# Patient Record
Sex: Female | Born: 1970 | Race: Black or African American | Hispanic: No | Marital: Married | Smoking: Never smoker
Health system: Southern US, Community
[De-identification: ages and names within clinical notes are randomized; demographics above are authoritative.]

## PROBLEM LIST (undated history)

## (undated) DIAGNOSIS — D259 Leiomyoma of uterus, unspecified: Secondary | ICD-10-CM

## (undated) DIAGNOSIS — Z789 Other specified health status: Secondary | ICD-10-CM

## (undated) DIAGNOSIS — E669 Obesity, unspecified: Secondary | ICD-10-CM

## (undated) HISTORY — PX: DIAGNOSTIC LAPAROSCOPY: SUR761

## (undated) HISTORY — DX: Leiomyoma of uterus, unspecified: D25.9

## (undated) HISTORY — DX: Obesity, unspecified: E66.9

---

## 2003-12-07 ENCOUNTER — Other Ambulatory Visit: Admission: RE | Admit: 2003-12-07 | Discharge: 2003-12-07 | Payer: Self-pay | Admitting: Obstetrics and Gynecology

## 2005-06-29 ENCOUNTER — Ambulatory Visit (HOSPITAL_COMMUNITY): Admission: RE | Admit: 2005-06-29 | Discharge: 2005-06-29 | Payer: Self-pay | Admitting: Internal Medicine

## 2005-08-02 ENCOUNTER — Other Ambulatory Visit: Admission: RE | Admit: 2005-08-02 | Discharge: 2005-08-02 | Payer: Self-pay | Admitting: Gynecology

## 2005-08-14 ENCOUNTER — Encounter: Admission: RE | Admit: 2005-08-14 | Discharge: 2005-08-14 | Payer: Self-pay | Admitting: Family Medicine

## 2005-10-19 ENCOUNTER — Ambulatory Visit (HOSPITAL_COMMUNITY): Admission: RE | Admit: 2005-10-19 | Discharge: 2005-10-19 | Payer: Self-pay | Admitting: Gynecology

## 2005-10-19 ENCOUNTER — Encounter (INDEPENDENT_AMBULATORY_CARE_PROVIDER_SITE_OTHER): Payer: Self-pay | Admitting: Specialist

## 2008-06-24 ENCOUNTER — Inpatient Hospital Stay (HOSPITAL_COMMUNITY): Admission: AD | Admit: 2008-06-24 | Discharge: 2008-06-28 | Payer: Self-pay | Admitting: Obstetrics and Gynecology

## 2008-06-25 ENCOUNTER — Encounter (INDEPENDENT_AMBULATORY_CARE_PROVIDER_SITE_OTHER): Payer: Self-pay | Admitting: Obstetrics and Gynecology

## 2008-06-29 ENCOUNTER — Encounter: Admission: RE | Admit: 2008-06-29 | Discharge: 2008-07-29 | Payer: Self-pay | Admitting: Obstetrics and Gynecology

## 2008-07-30 ENCOUNTER — Encounter: Admission: RE | Admit: 2008-07-30 | Discharge: 2008-08-28 | Payer: Self-pay | Admitting: Obstetrics and Gynecology

## 2008-08-29 ENCOUNTER — Encounter: Admission: RE | Admit: 2008-08-29 | Discharge: 2008-09-08 | Payer: Self-pay | Admitting: Obstetrics and Gynecology

## 2010-07-20 LAB — CBC
HCT: 32.5 % — ABNORMAL LOW (ref 36.0–46.0)
Hemoglobin: 10.8 g/dL — ABNORMAL LOW (ref 12.0–15.0)
MCHC: 33.3 g/dL (ref 30.0–36.0)
MCV: 88.3 fL (ref 78.0–100.0)
Platelets: 140 10*3/uL — ABNORMAL LOW (ref 150–400)
Platelets: 170 10*3/uL (ref 150–400)
RBC: 3.68 MIL/uL — ABNORMAL LOW (ref 3.87–5.11)
RBC: 4.33 MIL/uL (ref 3.87–5.11)
RDW: 13.9 % (ref 11.5–15.5)
WBC: 7.7 10*3/uL (ref 4.0–10.5)
WBC: 9.8 10*3/uL (ref 4.0–10.5)

## 2010-07-20 LAB — RPR: RPR Ser Ql: NONREACTIVE

## 2010-08-22 NOTE — Discharge Summary (Signed)
NAME:  Diane Ryan, Diane Ryan          ACCOUNT NO.:  [id number]   MEDICAL RECORD NO.:  [id number]          PATIENT TYPE:  INP   LOCATION:  9125                          FACILITY:  [facility]   PHYSICIAN:  Maxie Better, M.D.DATE OF BIRTH:  16-Sep-1970   DATE OF ADMISSION:  06/24/2008  DATE OF DISCHARGE:  06/28/2008                               DISCHARGE SUMMARY   ADMISSION DIAGNOSES:  Oblique lie, term gestation, desires  sterilization, requesting primary cesarean section. Fibroid uterus,  Biophysical profile 6/10 x2 days.   DISCHARGE DIAGNOSES:  Fibroid uterus, desires sterilization, oblique  lie, term gestation, delivered.  Biophysical profile 6/10 x2 days.   PROCEDURE:  Primary cesarean section, low vertical uterine incision,  right partial salpingectomy, myomectomy.   HISTORY OF PRESENT ILLNESS:  This is a 40 year old, gravida 4, para 0-0-  3-0, married black female at 39-5/7th weeks with known fibroid uterus  who was found to have an oblique lie and biophysical profile of 6/10 x2  days, nonreactive NSTs with minimal variability x2, who is now admitted  for a primary cesarean section.  The patient requested to have a  cesarean section and in light of the malpresentation her request was  honored.  She also desired permanent sterilization.   HOSPITAL COURSE:  The patient was admitted to [facility].  She  underwent a primary cesarean section.  A live female from the left lower  quadrant was delivered.  Cord around the neck x2 which was reducible.  Apgars of 9 and 9.  Weight of 6 pounds 10 ounces.  The patient had a  right lateral fibroid that needed to be removed in order to facilitate  closure of a low vertical uterine incision.  She had surgical separation  of her left tube already from previous surgery.  Ovaries were normal.  Right tube was normal.  There was also submucosal fibroid which was also  removed.  Both the placenta and myoma x2 and a portion of the right tube  was  sent to Pathology, results of which are pending at this time.  The  patient on postop day #1 had a hemoglobin of 10.8, hematocrit of 22.5,  platelet count of 140,000.  Starting platelet count was 170,000.  The  patient had positive flatus, tolerating a regular diet.  She had  initially slow bowel function which subsequently resolved.  She remained  afebrile throughout her course.  The incision had no erythema,  induration, or exudate, and she was deemed well to be discharged on  postop day #4.   DISPOSITION:  Home.   CONDITION:  Stable.   DISCHARGE MEDICATIONS:  1. Motrin 800 mg every 6-8 hours p.r.n. pain.  2. Percocet 5/325 mg 1-2 tablets every 4-6 hours p.r.n. pain #30.  3. Prenatal vitamins 1 p.o. daily.   DISCHARGE INSTRUCTIONS:  Per the postpartum booklet given.  Followup  appointment at [facility] in 6 weeks.      Maxie Better, M.D.  Electronically Signed     [place]/MEDQ  D:  06/28/2008  T:  06/29/2008  Job:  [id number]

## 2010-08-22 NOTE — Op Note (Signed)
NAME:  Diane Ryan, Diane Ryan          ACCOUNT NO.:  [id number]   MEDICAL RECORD NO.:  [id number]          PATIENT TYPE:  INP   LOCATION:  9125                          [facility]  [facility]   PHYSICIAN:  Maxie Better, M.D.DATE OF BIRTH:  1970/05/21   DATE OF PROCEDURE:  06/24/2008  DATE OF DISCHARGE:                               OPERATIVE REPORT   PREOPERATIVE DIAGNOSES:  Fibroid uterus, oblique lie, biophysical  profile with 6/10 x2 days, desires sterilization, term gestation.   PROCEDURE:  Primary cesarean section, low vertical uterine incision,  right partial salpingectomy, myomectomy x2.   POSTOPERATIVE DIAGNOSES:  Fibroid uterus, biophysical profile 6/10 x2  days, oblique lie, desires sterilization, term gestation.   ANESTHESIA:  Spinal.   SURGEON:  Maxie Better, MD   ASSISTANT:  Genia Del, MD   PROCEDURE:  Under adequate spinal anesthesia, the patient was placed in  the supine position with a left lateral tilt.  An indwelling Foley  catheter was sterilely placed.  The patient was sterilely prepped and  draped in the usual fashion.  A 0.25% Marcaine was injected along the  planned Pfannenstiel skin incision site.  Pfannenstiel skin incision was  then made, carried down to the rectus fascia.  Rectus fascia was opened  transversely.  Rectus fascia was then bluntly and sharply dissected off  the rectus muscle in superior and inferior fashion.  The rectus muscles  split in midline.  The peritoneum was entered bluntly and extended.  The  vesicouterine peritoneum was inspected and the uterus was rotated to the  right lower uterine segment to the lateral side of fibroid about 6 cm  and the presenting part was in the left lower quadrant.  Prominent  vessels were noted.  The vesicouterine peritoneum was partially opened.  However, on further palpation the transverse lower uterine incision  would have resulted in running into that fibroid on the right.  A low  vertical  uterine incision was then made and extended with bandage  scissors.  Artificial rupture of membranes was performed.  Copious clear  fluid was noted.  The presenting part which was floating after the  rupture of membranes did not allow for initial stability.  Subsequently,  a live female was delivered.  Cord around the neck x2 was reducible.  Baby  was bulb suctioned.  The abdominal cord was clamped and cut.  The baby  was transferred to the awaiting pediatrician who assigned Apgars of 9  and 9 at 1 and 5 minutes.  Placenta was manually removed.  The uterus  was cleaned of debris.  As soon as the placenta was out, the uterus  started to clamp down.  The fibroid which was in the right lateral  became more prominent and majority of it was submucosal and into the  right side of the uterine incision that needed to be closed.  In order  for the uterine incision which was still vertical to be closed, the  fibroid removal was necessary at that point.  The fibroid was removed in  the usual fashion.  The defect was closed with 0 Vicryl sutures to allow  for the  wall of the uterus to be able to be approximated.  In addition,  there was a submucosal fibroid at the top of that incision that was  removed and that defect was closed with 0 Vicryl as well.  The uterine  incision was then closed with 0 Monocryl running lock stitch in 2  layers.  Small bleeders were cauterized.  Multiple other fibroids was  noted, but we left.  The left tube was previously surgically separated.  The left ovary was normal.  The right ovary was normal.  The right tube  was grasped, midportion of the fallopian tube was grasped.  The  underlying mesosalpinx was opened proximally and distally.  The tube was  tied with 0 chromic suture x2 proximally and distally.  Intrauterine  segment of tube was then removed.  The abdomen was then copiously  irrigated and suctioned of debris.  The uterine incision had good  hemostasis.  The  parietal peritoneum was then closed with 2-0 Vicryl.  The rectus fascia was closed with 0 Vicryl x2.  The subcutaneous area  was irrigated, small bleeders cauterized.  Interrupted 2-0 plain sutures  placed and skin approximated using Ethicon staples.  Specimen were  portion of the right fallopian tube, myoma x2.  Placenta all sent to  pathology.  Estimated blood loss 100 mL.  Urine output 150 mL.  Fluid  intraoperative 3 L.  Sponge and instrument counts x2 was correct.  Complication was none.  Weight of the baby was 6 pounds 10 ounces.  The  patient tolerated the procedure well and was transferred to recovery in  stable condition.      Maxie Better, M.D.  Electronically Signed     /MEDQ  D:  06/24/2008  T:  06/25/2008  Job:  [id number]

## 2010-08-25 NOTE — H&P (Signed)
NAME:  Diane Ryan, Diane Ryan        ACCOUNT NO.:  [id number]   MEDICAL RECORD NO.:  [id number]          PATIENT TYPE:  AMB   LOCATION:                                [facility]  [facility]   PHYSICIAN:  Ivor Costa. Farrel Gobble, M.D. DATE OF BIRTH:  January 17, 1971   DATE OF ADMISSION:  10/18/2005  DATE OF DISCHARGE:                                HISTORY & PHYSICAL   CHIEF COMPLAINT:  The patient is a 40 year old, G2, P0, with intermittent  left lower quadrant pain for several years, which brought her to urgent care  on [address]  The patient had an ultrasound performed, which showed a  large fibroid on the left-hand side, and then an MRI, which confirmed the  placement of the fibroid, which was about 6 x 6 cm and felt to be  pedunculated.  The patient has a history of irregular cycles and bleeds as  close as every 2 weeks, which prompted a TSH and Prolactin, both of which  were normal.  She had a sonohysterogram, which showed no intracavitary  defects and a smooth lining.  The patient, however, was also found to have 4  other fibroids, the largest of which was subserosal and 3 x 2.  She also had  4 intramurals, the largest of which was 24 x 18, the second being 20 x 20,  the other 2 being approximately 1 cm.   PAST OB-GYN HISTORY:  Remarkable for 2 spontaneous ABs in the early first  trimester, neither of which was a desired pregnancy, with no post-abortal  complications.  She has no post-coital bleeding.  She has no dyspareunia.  She takes Motrin for dysmenorrhea.  Her Pap smears have been normal.   PAST MEDICAL HISTORY:  Significant for scoliosis, which gives her associated  back pain.   PAST SURGICAL HISTORY:  Negative.   MEDICATIONS:  She take muscle relaxants and ibuprofen on a p.r.n. basis.   SOCIAL HISTORY:  She is separated.  Social alcohol.  No tobacco.  Little  exercise.   FAMILY HISTORY:  Negative for gynecologic cancers, although there is a  history of colon cancer in her  grandmother, as well as grand uncle.  There  is also a family history of diabetes and hypertension.   PHYSICAL EXAMINATION:  GENERAL:  She is a well-appearing female, in no acute  distress.  HEART:  Regular rate.  LUNGS:  Clear.  ABDOMEN:  Obese, soft and nontender, without an appreciable pannus.  GU:  She has normal external genitalia.  The BUS is negative.  The vagina  was pink and moist.  The uterus was bulky with what was felt to be the  pedunculated fibroid, filling the cul-de-sac on the left-hand side,  confirmed by rectovaginal exam.  The ovaries are not palpable.   ASSESSMENT:  Symptomatic pedunculated fibroid on the left.  The patient is  scheduled presumptively for a laparoscopic myomectomy.  If easily obtained,  we will plan on only removing the large, 6 x 6 cm fibroid.  If, however, we  convert this to a laparotomy, we may remove the two larger intramural and  subserosal fibroids, if  easily obtained.  The patient was agreeable.  She  was given a prescription for Tylox for postoperative pain.      Ivor Costa. Farrel Gobble, M.D.  Electronically Signed     THL/MEDQ  D:  10/18/2005  T:  10/18/2005  Job:  [id number]

## 2011-07-05 ENCOUNTER — Ambulatory Visit: Payer: 59 | Admitting: Family Medicine

## 2011-07-05 VITALS — BP 110/74 | HR 80 | Temp 98.2°F | Resp 18 | Ht 67.5 in | Wt 291.8 lb

## 2011-07-05 DIAGNOSIS — A088 Other specified intestinal infections: Secondary | ICD-10-CM

## 2011-07-05 DIAGNOSIS — K529 Noninfective gastroenteritis and colitis, unspecified: Secondary | ICD-10-CM

## 2011-07-05 DIAGNOSIS — F329 Major depressive disorder, single episode, unspecified: Secondary | ICD-10-CM

## 2011-07-05 LAB — POCT CBC
HCT, POC: 38.1 % (ref 37.7–47.9)
Lymph, poc: 1.7 (ref 0.6–3.4)
MPV: 9.8 fL (ref 0–99.8)
Platelet Count, POC: 263 10*3/uL (ref 142–424)
RDW, POC: 14.6 %
WBC: 3.1 10*3/uL — AB (ref 4.6–10.2)

## 2011-07-05 LAB — POCT URINALYSIS DIPSTICK
Blood, UA: NEGATIVE
Glucose, UA: NEGATIVE
Leukocytes, UA: NEGATIVE
Nitrite, UA: NEGATIVE
Spec Grav, UA: 1.02
Urobilinogen, UA: 1
pH, UA: 5.5

## 2011-07-05 MED ORDER — ONDANSETRON 8 MG PO TBDP: 8.0000 mg | ORAL_TABLET | Freq: Three times a day (TID) | ORAL | Status: AC | PRN

## 2011-07-05 NOTE — Progress Notes (Signed)
  Subjective:    Patient ID: Diane Ryan, female    DOB: 01-Jul-1970, 41 y.o.   MRN: [id number]  Diarrhea  This is a new problem. The current episode started in the past 7 days. The problem occurs 5 to 10 times per day. The problem has been gradually improving. The patient states that diarrhea awakens her from sleep. Associated symptoms include abdominal pain (cramping), chills, headaches and vomiting. Pertinent negatives include no fever. Risk factors include ill contacts. She has tried nothing for the symptoms.  Emesis  Associated symptoms include abdominal pain (cramping), chills, diarrhea and headaches. Pertinent negatives include no fever.   31 year old son sick last week with similar symptoms Prior to getting sick ate out at a [employer]  Review of Systems  Constitutional: Positive for chills. Negative for fever.  Gastrointestinal: Positive for vomiting, abdominal pain (cramping) and diarrhea.  Neurological: Positive for headaches.       Objective:   Physical Exam  Constitutional: She appears well-developed.  HENT:  Mouth/Throat: Mucous membranes are dry.  Neck: Neck supple.  Cardiovascular: Normal rate, regular rhythm and normal heart sounds.   Pulmonary/Chest: Effort normal and breath sounds normal.  Abdominal: Soft. Bowel sounds are increased. There is generalized tenderness.  Neurological: She is alert.  Skin: Skin is warm.      Results for orders placed in visit on 07/05/11  POCT CBC      Component Value Range   WBC 3.1 (*) 4.6 - 10.2 (K/uL)   Lymph, poc 1.7  0.6 - 3.4    POC LYMPH PERCENT 54.6 (*) 10 - 50 (%L)   MID (cbc) 0.4  0 - 0.9    POC MID % 12.6 (*) 0 - 12 (%M)   POC Granulocyte 1.0 (*) 2 - 6.9    Granulocyte percent 32.8 (*) 37 - 80 (%G)   RBC 4.80  4.04 - 5.48 (M/uL)   Hemoglobin 11.8 (*) 12.2 - 16.2 (g/dL)   HCT, POC 40.9  81.1 - 47.9 (%)   MCV 79.4 (*) 80 - 97 (fL)   MCH, POC 24.6 (*) 27 - 31.2 (pg)   MCHC 31.0 (*) 31.8 - 35.4 (g/dL)   RDW, POC 91.4     Platelet Count, POC 263  142 - 424 (K/uL)   MPV 9.8  0 - 99.8 (fL)  POCT URINALYSIS DIPSTICK      Component Value Range   Color, UA amber     Clarity, UA clear     Glucose, UA neg     Bilirubin, UA neg     Ketones, UA trace     Spec Grav, UA 1.020     Blood, UA neg     pH, UA 5.5     Protein, UA 30mg      Urobilinogen, UA 1.0     Nitrite, UA neg     Leukocytes, UA Negative         Assessment & Plan:   1. Depression  POCT CBC, POCT urinalysis dipstick  2. Gastroenteritis     See medications on AVS Zinc oxide as skin protectant  BRAT diet Work note X 2 days

## 2011-08-15 ENCOUNTER — Encounter: Payer: Self-pay | Admitting: *Deleted

## 2012-03-16 ENCOUNTER — Other Ambulatory Visit: Payer: Self-pay | Admitting: Family Medicine

## 2012-03-16 NOTE — Telephone Encounter (Signed)
Please pull paper chart.  

## 2012-03-17 NOTE — Telephone Encounter (Signed)
Chart pulled to PA pool [id number]

## 2013-02-26 ENCOUNTER — Other Ambulatory Visit: Payer: Self-pay | Admitting: Obstetrics and Gynecology

## 2013-03-02 ENCOUNTER — Encounter (HOSPITAL_COMMUNITY): Payer: Self-pay | Admitting: Pharmacist

## 2013-03-10 ENCOUNTER — Encounter (HOSPITAL_COMMUNITY): Payer: Self-pay

## 2013-03-10 ENCOUNTER — Encounter (HOSPITAL_COMMUNITY)
Admission: RE | Admit: 2013-03-10 | Discharge: 2013-03-10 | Disposition: A | Discharge status: Home / Self Care | Payer: 59 | Source: Ambulatory Visit | Attending: Obstetrics and Gynecology | Admitting: Obstetrics and Gynecology

## 2013-03-10 DIAGNOSIS — Z01812 Encounter for preprocedural laboratory examination: Secondary | ICD-10-CM | POA: Insufficient documentation

## 2013-03-10 DIAGNOSIS — Z01818 Encounter for other preprocedural examination: Secondary | ICD-10-CM | POA: Insufficient documentation

## 2013-03-10 HISTORY — DX: Other specified health status: Z78.9

## 2013-03-10 LAB — BASIC METABOLIC PANEL
BUN: 9 mg/dL (ref 6–23)
CO2: 27 mEq/L (ref 19–32)
Chloride: 99 mEq/L (ref 96–112)
Glucose, Bld: 116 mg/dL — ABNORMAL HIGH (ref 70–99)

## 2013-03-10 LAB — CBC
HCT: 37.1 % (ref 36.0–46.0)
Hemoglobin: 11.6 g/dL — ABNORMAL LOW (ref 12.0–15.0)
MCH: 25.1 pg — ABNORMAL LOW (ref 26.0–34.0)
MCHC: 31.3 g/dL (ref 30.0–36.0)
RBC: 4.62 MIL/uL (ref 3.87–5.11)
RDW: 14.4 % (ref 11.5–15.5)

## 2013-03-10 NOTE — Patient Instructions (Signed)
20 Diane Ryan  03/10/2013   Your procedure is scheduled on:  03/17/13  Enter through the Main Entrance of [facility] at 1100 AM.  Pick up the phone at the desk and dial [phone].   Call this number if you have problems the morning of surgery: [phone]   Remember:   Do not eat food:After Midnight.  Do not drink clear liquids: After Midnight.  Take these medicines the morning of surgery with A SIP OF WATER: NA   Do not wear jewelry, make-up or nail polish.  Do not wear lotions, powders, or perfumes. You may wear deodorant.  Do not shave 48 hours prior to surgery.  Do not bring valuables to the hospital.  [facility] is not   responsible for any belongings or valuables brought to the hospital.  Contacts, dentures or bridgework may not be worn into surgery.  Leave suitcase in the car. After surgery it may be brought to your room.  For patients admitted to the hospital, checkout time is 11:00 AM the day of              discharge.   Patients discharged the day of surgery will not be allowed to drive             home.  Name and phone number of your driver: NA  Special Instructions:   Shower using CHG 2 nights before surgery and the night before surgery.  If you shower the day of surgery use CHG.  Use special wash - you have one bottle of CHG for all showers.  You should use approximately 1/3 of the bottle for each shower.   Please read over the following fact sheets that you were given:   Surgical Site Infection Prevention

## 2013-03-16 MED ORDER — DEXTROSE 5 % IV SOLN
3.0000 g | INTRAVENOUS | Status: AC
  Administered 2013-03-17: 3 g via INTRAVENOUS
  Filled 2013-03-16: qty 3000

## 2013-03-17 ENCOUNTER — Encounter (HOSPITAL_COMMUNITY)
Admission: RE | Disposition: A | Discharge status: Home / Self Care | Payer: Self-pay | Source: Ambulatory Visit | Attending: Obstetrics and Gynecology

## 2013-03-17 ENCOUNTER — Encounter (HOSPITAL_COMMUNITY): Payer: Self-pay | Admitting: Anesthesiology

## 2013-03-17 ENCOUNTER — Encounter (HOSPITAL_COMMUNITY): Payer: 59 | Admitting: Certified Registered Nurse Anesthetist

## 2013-03-17 ENCOUNTER — Ambulatory Visit (HOSPITAL_COMMUNITY): Payer: 59 | Admitting: Certified Registered Nurse Anesthetist

## 2013-03-17 ENCOUNTER — Ambulatory Visit (HOSPITAL_COMMUNITY)
Admission: RE | Admit: 2013-03-17 | Discharge: 2013-03-18 | Disposition: A | Discharge status: Home / Self Care | Payer: 59 | Source: Ambulatory Visit | Attending: Obstetrics and Gynecology | Admitting: Obstetrics and Gynecology

## 2013-03-17 DIAGNOSIS — D251 Intramural leiomyoma of uterus: Secondary | ICD-10-CM | POA: Insufficient documentation

## 2013-03-17 DIAGNOSIS — K430 Incisional hernia with obstruction, without gangrene: Secondary | ICD-10-CM | POA: Insufficient documentation

## 2013-03-17 DIAGNOSIS — Z90710 Acquired absence of both cervix and uterus: Secondary | ICD-10-CM

## 2013-03-17 DIAGNOSIS — N946 Dysmenorrhea, unspecified: Secondary | ICD-10-CM | POA: Insufficient documentation

## 2013-03-17 DIAGNOSIS — D252 Subserosal leiomyoma of uterus: Secondary | ICD-10-CM | POA: Insufficient documentation

## 2013-03-17 DIAGNOSIS — N920 Excessive and frequent menstruation with regular cycle: Principal | ICD-10-CM | POA: Insufficient documentation

## 2013-03-17 HISTORY — PX: ROBOTIC ASSISTED TOTAL HYSTERECTOMY: SHX6085

## 2013-03-17 HISTORY — PX: BILATERAL SALPINGECTOMY: SHX5743

## 2013-03-17 SURGERY — ROBOTIC ASSISTED TOTAL HYSTERECTOMY
Anesthesia: General | Site: Abdomen

## 2013-03-17 MED ORDER — HYDROMORPHONE HCL PF 1 MG/ML IJ SOLN
INTRAMUSCULAR | Status: DC | PRN
  Administered 2013-03-17 (×2): 1 mg via INTRAVENOUS

## 2013-03-17 MED ORDER — MENTHOL 3 MG MT LOZG: 1.0000 | LOZENGE | OROMUCOSAL | Status: DC | PRN

## 2013-03-17 MED ORDER — GLYCOPYRROLATE 0.2 MG/ML IJ SOLN
INTRAMUSCULAR | Status: AC
  Filled 2013-03-17: qty 2

## 2013-03-17 MED ORDER — HYDROMORPHONE HCL PF 1 MG/ML IJ SOLN
0.2500 mg | INTRAMUSCULAR | Status: DC | PRN
  Administered 2013-03-17 (×2): 0.5 mg via INTRAVENOUS
  Administered 2013-03-17 (×2): 0.25 mg via INTRAVENOUS

## 2013-03-17 MED ORDER — ONDANSETRON HCL 4 MG/2ML IJ SOLN
INTRAMUSCULAR | Status: DC | PRN
  Administered 2013-03-17 (×2): 2 mg via INTRAVENOUS

## 2013-03-17 MED ORDER — KETOROLAC TROMETHAMINE 30 MG/ML IJ SOLN
INTRAMUSCULAR | Status: AC
  Filled 2013-03-17: qty 1

## 2013-03-17 MED ORDER — ROCURONIUM BROMIDE 100 MG/10ML IV SOLN
INTRAVENOUS | Status: DC | PRN
  Administered 2013-03-17: 10 mg via INTRAVENOUS
  Administered 2013-03-17: 60 mg via INTRAVENOUS
  Administered 2013-03-17: 5 mg via INTRAVENOUS

## 2013-03-17 MED ORDER — IBUPROFEN 800 MG PO TABS
800.0000 mg | ORAL_TABLET | Freq: Three times a day (TID) | ORAL | Status: DC | PRN
  Administered 2013-03-18: 800 mg via ORAL
  Filled 2013-03-17: qty 1

## 2013-03-17 MED ORDER — HYDROMORPHONE HCL PF 1 MG/ML IJ SOLN
INTRAMUSCULAR | Status: AC
  Filled 2013-03-17: qty 1

## 2013-03-17 MED ORDER — DEXAMETHASONE SODIUM PHOSPHATE 10 MG/ML IJ SOLN
INTRAMUSCULAR | Status: AC
  Filled 2013-03-17: qty 1

## 2013-03-17 MED ORDER — PROMETHAZINE HCL 25 MG/ML IJ SOLN: 6.2500 mg | INTRAMUSCULAR | Status: DC | PRN

## 2013-03-17 MED ORDER — DEXTROSE IN LACTATED RINGERS 5 % IV SOLN
INTRAVENOUS | Status: DC
  Administered 2013-03-17 – 2013-03-18 (×2): via INTRAVENOUS

## 2013-03-17 MED ORDER — ARTIFICIAL TEARS OP OINT
TOPICAL_OINTMENT | OPHTHALMIC | Status: AC
  Filled 2013-03-17: qty 3.5

## 2013-03-17 MED ORDER — DEXAMETHASONE SODIUM PHOSPHATE 10 MG/ML IJ SOLN
INTRAMUSCULAR | Status: DC | PRN
  Administered 2013-03-17: 10 mg via INTRAVENOUS

## 2013-03-17 MED ORDER — ONDANSETRON HCL 4 MG/2ML IJ SOLN
4.0000 mg | Freq: Four times a day (QID) | INTRAMUSCULAR | Status: DC | PRN
  Administered 2013-03-18: 4 mg via INTRAVENOUS
  Filled 2013-03-17: qty 2

## 2013-03-17 MED ORDER — ONDANSETRON HCL 4 MG PO TABS: 4.0000 mg | ORAL_TABLET | Freq: Four times a day (QID) | ORAL | Status: DC | PRN

## 2013-03-17 MED ORDER — PROPOFOL 10 MG/ML IV BOLUS
INTRAVENOUS | Status: DC | PRN
  Administered 2013-03-17: 200 mg via INTRAVENOUS

## 2013-03-17 MED ORDER — ARTIFICIAL TEARS OP OINT
TOPICAL_OINTMENT | OPHTHALMIC | Status: DC | PRN
  Administered 2013-03-17: 1 via OPHTHALMIC

## 2013-03-17 MED ORDER — LIDOCAINE HCL (CARDIAC) 20 MG/ML IV SOLN
INTRAVENOUS | Status: DC | PRN
  Administered 2013-03-17: 100 mg via INTRAVENOUS

## 2013-03-17 MED ORDER — NEOSTIGMINE METHYLSULFATE 1 MG/ML IJ SOLN
INTRAMUSCULAR | Status: AC
  Filled 2013-03-17: qty 1

## 2013-03-17 MED ORDER — KETOROLAC TROMETHAMINE 30 MG/ML IJ SOLN
INTRAMUSCULAR | Status: DC | PRN
  Administered 2013-03-17: 30 mg via INTRAVENOUS
  Administered 2013-03-17: 30 mg via INTRAMUSCULAR

## 2013-03-17 MED ORDER — HYDROMORPHONE HCL PF 1 MG/ML IJ SOLN: 0.2000 mg | INTRAMUSCULAR | Status: DC | PRN

## 2013-03-17 MED ORDER — NEOSTIGMINE METHYLSULFATE 1 MG/ML IJ SOLN
INTRAMUSCULAR | Status: DC | PRN
  Administered 2013-03-17: 3 mg via INTRAVENOUS

## 2013-03-17 MED ORDER — HYDROMORPHONE HCL PF 1 MG/ML IJ SOLN
INTRAMUSCULAR | Status: AC
  Administered 2013-03-17: 0.5 mg via INTRAVENOUS
  Filled 2013-03-17: qty 1

## 2013-03-17 MED ORDER — PANTOPRAZOLE SODIUM 40 MG PO TBEC
40.0000 mg | DELAYED_RELEASE_TABLET | Freq: Every day | ORAL | Status: DC
  Administered 2013-03-18: 40 mg via ORAL
  Filled 2013-03-17 (×2): qty 1

## 2013-03-17 MED ORDER — BUPIVACAINE HCL (PF) 0.25 % IJ SOLN
INTRAMUSCULAR | Status: DC | PRN
  Administered 2013-03-17: 10 mL

## 2013-03-17 MED ORDER — BUPIVACAINE HCL (PF) 0.25 % IJ SOLN
INTRAMUSCULAR | Status: AC
  Filled 2013-03-17: qty 10

## 2013-03-17 MED ORDER — GLYCOPYRROLATE 0.2 MG/ML IJ SOLN
INTRAMUSCULAR | Status: DC | PRN
  Administered 2013-03-17: 0.1 mg via INTRAVENOUS
  Administered 2013-03-17: .1 mg via INTRAVENOUS
  Administered 2013-03-17: 0.4 mg via INTRAVENOUS

## 2013-03-17 MED ORDER — KETOROLAC TROMETHAMINE 30 MG/ML IJ SOLN
15.0000 mg | Freq: Once | INTRAMUSCULAR | Status: DC | PRN
Start: 2013-03-17 — End: 2013-03-17

## 2013-03-17 MED ORDER — LACTATED RINGERS IV SOLN
INTRAVENOUS | Status: DC
  Administered 2013-03-17 (×2): via INTRAVENOUS

## 2013-03-17 MED ORDER — MIDAZOLAM HCL 2 MG/2ML IJ SOLN
INTRAMUSCULAR | Status: DC | PRN
  Administered 2013-03-17: 1.5 mg via INTRAVENOUS
  Administered 2013-03-17: 0.5 mg via INTRAVENOUS

## 2013-03-17 MED ORDER — MIDAZOLAM HCL 2 MG/2ML IJ SOLN
INTRAMUSCULAR | Status: AC
  Filled 2013-03-17: qty 2

## 2013-03-17 MED ORDER — OXYCODONE-ACETAMINOPHEN 5-325 MG PO TABS
1.0000 | ORAL_TABLET | ORAL | Status: DC | PRN
  Administered 2013-03-18: 1 via ORAL
  Filled 2013-03-17: qty 1

## 2013-03-17 MED ORDER — LACTATED RINGERS IR SOLN
Status: DC | PRN
  Administered 2013-03-17: 3000 mL

## 2013-03-17 MED ORDER — SUFENTANIL CITRATE 50 MCG/ML IV SOLN
INTRAVENOUS | Status: DC | PRN
  Administered 2013-03-17 (×4): 10 ug via INTRAVENOUS

## 2013-03-17 MED ORDER — SUFENTANIL CITRATE 50 MCG/ML IV SOLN
INTRAVENOUS | Status: AC
  Filled 2013-03-17: qty 1

## 2013-03-17 MED ORDER — ZOLPIDEM TARTRATE 5 MG PO TABS: 5.0000 mg | ORAL_TABLET | Freq: Every evening | ORAL | Status: DC | PRN

## 2013-03-17 MED ORDER — PROPOFOL 10 MG/ML IV EMUL
INTRAVENOUS | Status: AC
  Filled 2013-03-17: qty 20

## 2013-03-17 MED ORDER — LIDOCAINE HCL (CARDIAC) 20 MG/ML IV SOLN
INTRAVENOUS | Status: AC
  Filled 2013-03-17: qty 5

## 2013-03-17 MED ORDER — KETOROLAC TROMETHAMINE 30 MG/ML IJ SOLN
30.0000 mg | Freq: Four times a day (QID) | INTRAMUSCULAR | Status: DC
  Administered 2013-03-17 – 2013-03-18 (×2): 30 mg via INTRAVENOUS
  Filled 2013-03-17 (×2): qty 1

## 2013-03-17 MED ORDER — KETOROLAC TROMETHAMINE 30 MG/ML IJ SOLN: 30.0000 mg | Freq: Four times a day (QID) | INTRAMUSCULAR | Status: DC

## 2013-03-17 MED ORDER — ONDANSETRON HCL 4 MG/2ML IJ SOLN
INTRAMUSCULAR | Status: AC
  Filled 2013-03-17: qty 2

## 2013-03-17 SURGICAL SUPPLY — 65 items
ADH SKN CLS APL DERMABOND .7 (GAUZE/BANDAGES/DRESSINGS) ×2
BAG URINE DRAINAGE (UROLOGICAL SUPPLIES) ×3 IMPLANT
BARRIER ADHS 3X4 INTERCEED (GAUZE/BANDAGES/DRESSINGS) ×3 IMPLANT
BRR ADH 4X3 ABS CNTRL BYND (GAUZE/BANDAGES/DRESSINGS) ×2
CATH FOLEY 3WAY  5CC 16FR (CATHETERS) ×1
CATH FOLEY 3WAY 5CC 16FR (CATHETERS) ×2 IMPLANT
CHLORAPREP W/TINT 26ML (MISCELLANEOUS) ×3 IMPLANT
CLOTH BEACON ORANGE TIMEOUT ST (SAFETY) ×3 IMPLANT
CONT PATH 16OZ SNAP LID 3702 (MISCELLANEOUS) ×3 IMPLANT
COVER MAYO STAND STRL (DRAPES) ×3 IMPLANT
COVER TABLE BACK 60X90 (DRAPES) ×6 IMPLANT
COVER TIP SHEARS 8 DVNC (MISCELLANEOUS) ×2 IMPLANT
COVER TIP SHEARS 8MM DA VINCI (MISCELLANEOUS) ×1
DECANTER SPIKE VIAL GLASS SM (MISCELLANEOUS) ×3 IMPLANT
DERMABOND ADVANCED (GAUZE/BANDAGES/DRESSINGS) ×1
DERMABOND ADVANCED .7 DNX12 (GAUZE/BANDAGES/DRESSINGS) ×2 IMPLANT
DEVICE TROCAR PUNCTURE CLOSURE (ENDOMECHANICALS) IMPLANT
DRAPE HUG U DISPOSABLE (DRAPE) ×3 IMPLANT
DRAPE LG THREE QUARTER DISP (DRAPES) ×6 IMPLANT
DRAPE WARM FLUID 44X44 (DRAPE) ×3 IMPLANT
ELECT REM PT RETURN 9FT ADLT (ELECTROSURGICAL) ×3
ELECTRODE REM PT RTRN 9FT ADLT (ELECTROSURGICAL) ×2 IMPLANT
EVACUATOR SMOKE 8.L (FILTER) ×3 IMPLANT
GAUZE VASELINE 3X9 (GAUZE/BANDAGES/DRESSINGS) IMPLANT
GLOVE BIOGEL PI IND STRL 7.0 (GLOVE) ×4 IMPLANT
GLOVE BIOGEL PI INDICATOR 7.0 (GLOVE) ×2
GLOVE ECLIPSE 6.5 STRL STRAW (GLOVE) ×3 IMPLANT
GOWN STRL REIN XL XLG (GOWN DISPOSABLE) ×18 IMPLANT
KIT ACCESSORY DA VINCI DISP (KITS) ×1
KIT ACCESSORY DVNC DISP (KITS) ×2 IMPLANT
LEGGING LITHOTOMY PAIR STRL (DRAPES) ×3 IMPLANT
NDL INSUFFLATION 14GA 150MM (NEEDLE) ×2 IMPLANT
NEEDLE INSUFFLATION 14GA 150MM (NEEDLE) ×3 IMPLANT
OCCLUDER COLPOPNEUMO (BALLOONS) ×1 IMPLANT
PACK LAVH (CUSTOM PROCEDURE TRAY) ×3 IMPLANT
PAD PREP 24X48 CUFFED NSTRL (MISCELLANEOUS) ×6 IMPLANT
PLUG CATH AND CAP STER (CATHETERS) ×3 IMPLANT
PROTECTOR NERVE ULNAR (MISCELLANEOUS) ×6 IMPLANT
SCISSORS LAP 5X35 DISP (ENDOMECHANICALS) IMPLANT
SET CYSTO W/LG BORE CLAMP LF (SET/KITS/TRAYS/PACK) ×1 IMPLANT
SET IRRIG TUBING LAPAROSCOPIC (IRRIGATION / IRRIGATOR) ×3 IMPLANT
SOLUTION ELECTROLUBE (MISCELLANEOUS) ×3 IMPLANT
SUT VIC AB 0 CT1 36 (SUTURE) ×6 IMPLANT
SUT VICRYL 0 UR6 27IN ABS (SUTURE) ×3 IMPLANT
SUT VLOC 180 0 6IN GS21 (SUTURE) ×2 IMPLANT
SUT VLOC 180 0 9IN  GS21 (SUTURE) ×1
SUT VLOC 180 0 9IN GS21 (SUTURE) ×2 IMPLANT
SYR 50ML LL SCALE MARK (SYRINGE) ×3 IMPLANT
SYRINGE 10CC LL (SYRINGE) ×3 IMPLANT
TIP RUMI ORANGE 6.7MMX12CM (TIP) IMPLANT
TIP UTERINE 5.1X6CM LAV DISP (MISCELLANEOUS) IMPLANT
TIP UTERINE 6.7X10CM GRN DISP (MISCELLANEOUS) IMPLANT
TIP UTERINE 6.7X6CM WHT DISP (MISCELLANEOUS) IMPLANT
TIP UTERINE 6.7X8CM BLUE DISP (MISCELLANEOUS) ×1 IMPLANT
TOWEL OR 17X24 6PK STRL BLUE (TOWEL DISPOSABLE) ×9 IMPLANT
TROCAR 12M 150ML BLUNT (TROCAR) IMPLANT
TROCAR 5M 150ML BLDLS (TROCAR) ×1 IMPLANT
TROCAR DISP BLADELESS 8 DVNC (TROCAR) IMPLANT
TROCAR DISP BLADELESS 8MM (TROCAR) ×1
TROCAR XCEL 12X100 BLDLESS (ENDOMECHANICALS) ×3 IMPLANT
TROCAR XCEL NON-BLD 5MMX100MML (ENDOMECHANICALS) ×6 IMPLANT
TROCAR Z-THREAD 12X150 (TROCAR) ×3 IMPLANT
TUBING FILTER THERMOFLATOR (ELECTROSURGICAL) ×3 IMPLANT
WARMER LAPAROSCOPE (MISCELLANEOUS) ×3 IMPLANT
WATER STERILE IRR 1000ML POUR (IV SOLUTION) ×9 IMPLANT

## 2013-03-17 NOTE — Preoperative (Signed)
Beta Blockers   Reason not to administer Beta Blockers:Not Applicable 

## 2013-03-17 NOTE — Transfer of Care (Signed)
Immediate Anesthesia Transfer of Care Note  Patient: Diane Ryan  Procedure(s) Performed: Procedure(s) with comments: ROBOTIC ASSISTED TOTAL HYSTERECTOMY (N/A) - 3 hrs. BILATERAL SALPINGECTOMY (Bilateral)  Patient Location: PACU  Anesthesia Type:General  Level of Consciousness: awake, alert  and oriented  Airway & Oxygen Therapy: Patient Spontanous Breathing and Patient connected to nasal cannula oxygen  Post-op Assessment: Report given to PACU RN, Post -op Vital signs reviewed and stable and Patient moving all extremities X 4  Post vital signs: Reviewed and stable  Complications: No apparent anesthesia complications

## 2013-03-17 NOTE — Anesthesia Postprocedure Evaluation (Signed)
  Anesthesia Post-op Note  Patient: Diane Ryan  Procedure(s) Performed: Procedure(s) with comments: ROBOTIC ASSISTED TOTAL HYSTERECTOMY (N/A) - 3 hrs. BILATERAL SALPINGECTOMY (Bilateral)  Patient Location: PACU  Anesthesia Type:General  Level of Consciousness: awake, alert  and oriented  Airway and Oxygen Therapy: Patient Spontanous Breathing  Post-op Pain: mild  Post-op Assessment: Post-op Vital signs reviewed, Patient's Cardiovascular Status Stable, Respiratory Function Stable, Patent Airway, No signs of Nausea or vomiting and Pain level controlled  Post-op Vital Signs: Reviewed and stable  Complications: No apparent anesthesia complications

## 2013-03-17 NOTE — Anesthesia Preprocedure Evaluation (Signed)
Anesthesia Evaluation  Patient identified by MRN, date of birth, ID band Patient awake    Reviewed: Allergy & Precautions, H&P , NPO status , Patient's Chart, lab work & pertinent test results  Airway Mallampati: II TM Distance: >3 FB Neck ROM: full    Dental no notable dental hx. (+) Teeth Intact   Pulmonary neg pulmonary ROS,    Pulmonary exam normal       Cardiovascular negative cardio ROS      Neuro/Psych negative neurological ROS     GI/Hepatic negative GI ROS, Neg liver ROS,   Endo/Other  negative endocrine ROS  Renal/GU negative Renal ROS  negative genitourinary   Musculoskeletal   Abdominal Normal abdominal exam  (+)   Peds  Hematology negative hematology ROS (+)   Anesthesia Other Findings   Reproductive/Obstetrics negative OB ROS                           Anesthesia Physical Anesthesia Plan  ASA: II  Anesthesia Plan: General   Post-op Pain Management:    Induction: Intravenous  Airway Management Planned: Oral ETT  Additional Equipment:   Intra-op Plan:   Post-operative Plan: Extubation in OR  Informed Consent: I have reviewed the patients History and Physical, chart, labs and discussed the procedure including the risks, benefits and alternatives for the proposed anesthesia with the patient or authorized representative who has indicated his/her understanding and acceptance.   Dental Advisory Given  Plan Discussed with: CRNA and Surgeon  Anesthesia Plan Comments:         Anesthesia Quick Evaluation

## 2013-03-17 NOTE — Brief Op Note (Signed)
03/17/2013  5:26 PM  PATIENT:  Diane Ryan  42 y.o. female  PRE-OPERATIVE DIAGNOSIS:  Menorrhagia, Fibroids, Dysmenorrhea    POST-OPERATIVE DIAGNOSIS:  menorrhagia, fibroids, Dysmenorrhea, abdominal wall adhesion, incarcerated incisional hernia  PROCEDURE:  Da vinci robotic total hysterectomy, bilateral salpingectomy, lysis of adhesions  SURGEON:  Surgeon(s) and Role:    * Vahan Wadsworth Cathie Beams, MD - Primary  PHYSICIAN ASSISTANT:   ASSISTANTS: Raelyn Mora, CNM   ANESTHESIA:   general Findings: fibroid uterus, surgical separated tubes, bladder adhesions, llq incisional hernia with loop of small bowel, omentum, nl ovaries  EBL:  Total I/O In: 1700 [I.V.:1700] Out: 200 [Urine:100; Blood:100]  BLOOD ADMINISTERED:none  DRAINS: none   LOCAL MEDICATIONS USED:  MARCAINE     SPECIMEN:  Source of Specimen:  uterus w/ cervix, tubes  DISPOSITION OF SPECIMEN:  PATHOLOGY  COUNTS:  YES  TOURNIQUET:  * No tourniquets in log *  DICTATION: .Other Dictation: Dictation Number [id number]  PLAN OF CARE: Admit for overnight observation  PATIENT DISPOSITION:  PACU - hemodynamically stable.   Delay start of Pharmacological VTE agent (>24hrs) due to surgical blood loss or risk of bleeding: no

## 2013-03-18 ENCOUNTER — Encounter (HOSPITAL_COMMUNITY): Payer: Self-pay | Admitting: Obstetrics and Gynecology

## 2013-03-18 LAB — BASIC METABOLIC PANEL
BUN: 8 mg/dL (ref 6–23)
CO2: 27 mEq/L (ref 19–32)
Chloride: 103 mEq/L (ref 96–112)
Creatinine, Ser: 0.71 mg/dL (ref 0.50–1.10)
Potassium: 4.7 mEq/L (ref 3.5–5.1)

## 2013-03-18 LAB — CBC
HCT: 33.8 % — ABNORMAL LOW (ref 36.0–46.0)
MCH: 24.8 pg — ABNORMAL LOW (ref 26.0–34.0)
MCHC: 31.4 g/dL (ref 30.0–36.0)
MCV: 79.2 fL (ref 78.0–100.0)
Platelets: 264 10*3/uL (ref 150–400)
RDW: 14 % (ref 11.5–15.5)
WBC: 11.9 10*3/uL — ABNORMAL HIGH (ref 4.0–10.5)

## 2013-03-18 MED ORDER — OXYCODONE-ACETAMINOPHEN 5-325 MG PO TABS: 1.0000 | ORAL_TABLET | ORAL | Status: AC | PRN

## 2013-03-18 NOTE — Anesthesia Postprocedure Evaluation (Signed)
  Anesthesia Post-op Note  Anesthesia Post Note  Patient: Diane Ryan  Procedure(s) Performed: Procedure(s) (LRB): ROBOTIC ASSISTED TOTAL HYSTERECTOMY (N/A) BILATERAL SALPINGECTOMY (Bilateral)  Anesthesia type: General  Patient location: Women's Unit  Post pain: Pain level controlled  Post assessment: Post-op Vital signs reviewed  Last Vitals:  Filed Vitals:   03/18/13 0600  BP: 103/60  Pulse: 80  Temp: 36.3 C  Resp: 20    Post vital signs: Reviewed  Level of consciousness: sedated  Complications: No apparent anesthesia complications

## 2013-03-18 NOTE — Discharge Summary (Signed)
Physician Discharge Summary  Patient ID: Diane Ryan MRN: [id number] DOB/AGE: March 15, 1971 42 y.o.  Admit date: 03/17/2013 Discharge date: 03/18/2013  Admission Diagnoses: menorrhagia, uterine fibroids, dysmenorrhea, previous cesarean section  Discharge Diagnoses: menorrhagia, uterine fibroids, incarcerated abdominal wall incisional hernia, prevuious cesarean section, dysmenorrhea  Active Problems:   S/P hysterectomy   Discharged Condition: stable  Hospital Course: pt was admitted to [facility]. She underwent daVinci robotic total hysterectomy, bilateral salpingectomy, LOA,  Removal of hernia content. Uncomplicated postop course  Consults: None  Significant Diagnostic Studies: labs: CBC    Component Value Date/Time   WBC 11.9* 03/18/2013 0535   WBC 3.1* 07/05/2011 1621   RBC 4.27 03/18/2013 0535   RBC 4.80 07/05/2011 1621   HGB 10.6* 03/18/2013 0535   HGB 11.8* 07/05/2011 1621   HCT 33.8* 03/18/2013 0535   HCT 38.1 07/05/2011 1621   PLT 264 03/18/2013 0535   MCV 79.2 03/18/2013 0535   MCV 79.4* 07/05/2011 1621   MCH 24.8* 03/18/2013 0535   MCH 24.6* 07/05/2011 1621   MCHC 31.4 03/18/2013 0535   MCHC 31.0* 07/05/2011 1621   RDW 14.0 03/18/2013 0535        Treatments: surgery: DaVinci robotic total hysterectomy, bilateral salpingectomy.LOA  Discharge Exam: Blood pressure 103/60, pulse 80, temperature 97.4 F (36.3 C), temperature source Oral, resp. rate 20, height 5\' 9"  (1.753 m), weight 127.461 kg (281 lb), SpO2 95.00%. General appearance: alert, cooperative and no distress Back: no tenderness to percussion or palpation Resp: clear to auscultation bilaterally Cardio: regular rate and rhythm, S1, S2 normal, no murmur, click, rub or gallop GI: soft, non-tender; bowel sounds normal; no masses,  no organomegaly and incision well approximated d/c/i Pelvic: deferred Extremities: no edema, redness or tenderness in the calves or thighs  Disposition:   Discharge Orders   Future Orders Complete By Expires   Diet general  As directed    Discharge instructions  As directed    Comments:     Call if temperature greater than equal to 100.4, nothing per vagina for 4-6 weeks or severe nausea vomiting, increased incisional pain , drainage or redness in the incision site, no straining with bowel movements, showers no bath   Discharge patient  As directed    Increase activity slowly  As directed    May walk up steps  As directed    No wound care  As directed        Medication List         oxyCODONE-acetaminophen 5-325 MG per tablet  Commonly known as:  PERCOCET/ROXICET  Take 1-2 tablets by mouth every 4 (four) hours as needed for severe pain (moderate to severe pain (when tolerating fluids)).           Follow-up Information   Follow up with Serita Kyle, MD.   Specialty:  Obstetrics and Gynecology   Contact information:   [address] [place] [address] [phone]       Signed: Maxie Better A 03/18/2013, 8:41 AM

## 2013-03-18 NOTE — Progress Notes (Signed)
Subjective: Patient reports nausea and no problems voiding.    Objective: I have reviewed patient's vital signs.  vital signs, intake and output and labs. Filed Vitals:   03/18/13 0600  BP: 103/60  Pulse: 80  Temp: 97.4 F (36.3 C)  Resp: 20   I/O last 3 completed shifts: In: 2380 [P.O.:480; I.V.:1900] Out: 2300 [Urine:2200; Blood:100]    Lab Results  Component Value Date   WBC 11.9* 03/18/2013   HGB 10.6* 03/18/2013   HCT 33.8* 03/18/2013   MCV 79.2 03/18/2013   PLT 264 03/18/2013   Lab Results  Component Value Date   CREATININE 0.71 03/18/2013    EXAM General: alert, cooperative and no distress Resp: clear to auscultation bilaterally Cardio: regular rate and rhythm, S1, S2 normal, no murmur, click, rub or gallop GI: soft obese nontender. Incisions well approximated dry/c/intact Extremities: no edema, redness or tenderness in the calves or thighs Vaginal Bleeding: none  Assessment: s/p Procedure(s): ROBOTIC ASSISTED TOTAL HYSTERECTOMY BILATERAL SALPINGECTOMY: stable and progressing well  Plan: Advance diet Encourage ambulation Advance to PO medication Discharge home D/c instructions reviewed.  F/u 2 weeks   LOS: 1 day    Mateus Rewerts A, MD 03/18/2013 8:34 AM    03/18/2013, 8:34 AM

## 2013-03-18 NOTE — Op Note (Signed)
NAMEMarland Kitchen  Diane Ryan          ACCOUNT NO.:  [id number]  MEDICAL RECORD NO.:  [id number]  LOCATION:  9311                          FACILITY:  [facility]  PHYSICIAN:  Maxie Better, M.D.DATE OF BIRTH:  [birth year]  DATE OF PROCEDURE:  03/17/2013 DATE OF DISCHARGE:  03/18/2013                              OPERATIVE REPORT   PREOPERATIVE DIAGNOSES: 1. Menorrhagia. 2. Dysmenorrhea. 3. Uterine fibroids. 4. Previous cesarean section.  PROCEDURES: 1. Da Vinci robotic total hysterectomy. 2. Bilateral salpingectomy. 3. Removal of incarcerated abdominal wall hernia content  POSTOPERATIVE DIAGNOSES: 1. Incarcerated left abdominal wall hernia. 2. Menorrhagia. 3. Uterine fibroids. 4. Previous cesarean section. 5. Dysmenorrhea.  ANESTHESIA:  General.  SURGEON:  Maxie Better, MD  ASSISTANT:  Denton Meek, CNM.  DESCRIPTION OF PROCEDURE:  Under adequate general anesthesia, the patient was placed in the dorsal lithotomy position.  She was positioned for robotic surgery.  Examination under anesthesia revealed about a 10- week size uterus.  No adnexal masses could be appreciated.  The patient was sterilely prepped and draped in usual fashion.  A 3-way Foley catheter was sterilely placed.  A weighted speculum was placed in the vagina.  Sims retractor was placed anteriorly.  0 Vicryl figure-of- eight suture was placed on the anterior and posterior lip of the cervix. The uterus sounded to 10 cm.  Cervix was then dilated.  A small RUMI cup with a #10 uterine manipulator was introduced into the uterine cavity without incident.  The retractors were removed.  Attention was then turned to the abdomen.  A 0.25% Marcaine was injected supraumbilically. A supraumbilical incision was made.  Veress needle was introduced, tested with subsequently 2.2 L of CO2 insufflated.  Veress needle was then removed.  A #12 mm disposable trocar was introduced into the abdomen without incident.  The  robotic camera was then placed through that port.  On inspection, no trauma to the underlying structures.  The patient was subsequently placed in deep Trendelenburg position.  Normal liver edge was noted.  Adhesions from the bladder to the lower uterine segment and anterior aspect of the uterus were noted.  There was also noted a loop of omental tissues on the left lower quadrant.  Uterus had clearly evidence of fibroids, and the surgical interruption of fallopian tubes were bilaterally noted.  Both ovaries were noted to be normal. The procedure was started with placement of 2 robotic ports sites 10 cm apart.  On the left, the upper robotic port site was placed first and the right lower quadrant assistant port was placed.  A right 8-mm robotic port was also placed.  Once that was done on the right, a retractor was then used to pull the adhesions medially in order to facilitate the placement of the last robotic port site in the left lower quadrant.  Once this was done, the robot was then docked.  In arm #1, a monopolar scissor was placed.  In arm #2, the PK dissector.  In arm #3, the ProGrasp.  I then went to the surgical console.  At the surgical console, panoramic inspection was quickly made and attention was then turned to that adhesion in the left lower quadrant.  Cold scissors was initially used, however,  on further manipulation, it was noted that there was a loop of small bowel that partly along with the omentum was a defect in the abdominal wall.  With careful dissection, the omentum first was pulled out of this incision.  Initially the way it was attached, it was pulled out and then additional length about what appears to be at least 2.5 to 3 inches of more omentum was then subsequently pulled out, that left the remaining serosal surface of the bowel, which appear to be looped up into the abdominal wall.  So, with careful dissection, palpation on the opposite side of the abdominal  wall overlying the defect by the assistant, the part of the bowel that was  remaining was removed and inspected.  The patient has had a previous laparoscopic myomectomy. Once this was done, attention was then turned to the pelvis.  Procedure was started on the left side.  The ureter was noted bilaterally.  The left retroperitoneal space was opened.  Dissection was started.  The ureter was not seen through the dissection, it was noted on the medial side where it appeared deep in the pelvis.  The remaining proximal portion of fallopian tube was grasped and the underlying mesosalpinx was serially clamped, cauterized, and then cut.  There was an opening made in the window of the peritoneum and the left uteroovarian ligament was serially clamped, cauterized, and then cut.  The peritoneum was opened further to displace the ureter further inferiorly.  The round ligament was then grasped in the midportion, cauterized, and cut.  Attention was then turned to the bladder, which had the adhesions, which were sharply dissected, displacing the bladder further away from the uterus.  Once all those adhesions were cleared, the vesicouterine peritoneum was then opened anteriorly and transversely, and sharp dissection was then performed to push the bladder further inferiorly.  Attention was then turned back to the left side.  The uterine vessels were then skeletonized.  Uterine vessels were then serially clamped, cauterized, and then subsequently cut.  Attention was turned to the opposite side on the right.  Again, the ureter was identified from the pelvic brim. The retroperitoneal space was opened on the right.  Dissection was done and the ureter was actually seen.  There was a distal portion of the fallopian tube noted on the right.  This was grasped.  The underlying mesosalpinx was serially clamped, cauterized, and then cut, removing that tube entirely.  The right utero-ovarian ligament was  then subsequently serially clamped, cauterized, and then cut.  The remaining portion of the vesicouterine peritoneum was developed on the right.  The uterine vessel was skeletonized, serially clamped, cauterized, and then cut.  The vaginal insufflator was then placed and the cup was done with the cervix being detached from the vagina.  The small independent fallopian tube portion was brought out through the vaginal defect and the uterus with remaining tube was also brought out through the vagina. Small bleeding in the posterior aspect of the vaginal cuff was cauterized.  The instruments were then replaced by long-tipped forceps and the large suture needle driver and the PK dissector was then placed in arm #3.  Using 0 Vicryl V-Loc suture x2, the vaginal cuff was closed with a running lock stitch x2.  Good hemostasis achieved. Intraoperatively, the digital exam confirmed, well approximation of the vaginal cuff.  Attention was then turned to the adnexa, both of which showed good hemostasis, and suture needle x2 was then removed through one of the  port sites without incident.  The abdomen was again irrigated and suctioned of debris.  When the procedure was noted to be complete, the robotic instruments were removed and the robot was undocked.  I left the surgical console and went sterilely to the patient's bedside. Reinspection again showed good hemostasis throughout and the bowel loop from the hernia site was still perfused well.  The robotic ports sites were then removed under direct visualization.  The supraumbilical site was removed under direct visualization, taking care not to bring up any underlying structures.  Once the abdomen was deflated and the port sites were hemostatic with cautery, 4-0 Vicryl subcuticular stitches were done for all the skin and in the supraumbilical site, the fascia was closed with 0 Vicryl figure-of-eight sutures.  The vagina was once again reinspected.   Digital exam by me showed good well approximation. The patient tolerated the procedure well.  COMPLICATIONS:  None.  ESTIMATED BLOOD LOSS:  100.  INTRAOPERATIVE FLUID:  2 L.  URINE OUTPUT:  100 mL concentrated urine.  COUNTS:  Sponge and instrument count x2 was correct.  The patient tolerated the procedure well and was transferred to the recovery room in stable condition.     Maxie Better, M.D.     [place]/MEDQ  D:  03/17/2013  T:  03/18/2013  Job:  [id number]

## 2013-03-18 NOTE — Progress Notes (Signed)
Pt ambulated out  Teaching complete 

## 2014-11-24 ENCOUNTER — Other Ambulatory Visit: Payer: Self-pay | Admitting: Internal Medicine

## 2014-11-24 DIAGNOSIS — Z1231 Encounter for screening mammogram for malignant neoplasm of breast: Secondary | ICD-10-CM

## 2014-12-06 ENCOUNTER — Ambulatory Visit
Admission: RE | Admit: 2014-12-06 | Discharge: 2014-12-06 | Disposition: A | Discharge status: Home / Self Care | Payer: BLUE CROSS/BLUE SHIELD | Source: Ambulatory Visit | Attending: Internal Medicine | Admitting: Internal Medicine

## 2014-12-06 DIAGNOSIS — Z1231 Encounter for screening mammogram for malignant neoplasm of breast: Secondary | ICD-10-CM

## 2015-11-15 ENCOUNTER — Other Ambulatory Visit: Payer: Self-pay | Admitting: Internal Medicine

## 2015-11-15 DIAGNOSIS — Z1231 Encounter for screening mammogram for malignant neoplasm of breast: Secondary | ICD-10-CM

## 2015-12-08 ENCOUNTER — Ambulatory Visit
Admission: RE | Admit: 2015-12-08 | Discharge: 2015-12-08 | Disposition: A | Discharge status: Home / Self Care | Payer: 59 | Source: Ambulatory Visit | Attending: Internal Medicine | Admitting: Internal Medicine

## 2015-12-08 DIAGNOSIS — Z1231 Encounter for screening mammogram for malignant neoplasm of breast: Secondary | ICD-10-CM

## 2016-06-14 ENCOUNTER — Encounter: Payer: Self-pay | Admitting: Nurse Practitioner

## 2016-06-27 ENCOUNTER — Ambulatory Visit (INDEPENDENT_AMBULATORY_CARE_PROVIDER_SITE_OTHER): Payer: 59 | Admitting: Nurse Practitioner

## 2016-06-27 ENCOUNTER — Encounter: Payer: Self-pay | Admitting: Nurse Practitioner

## 2016-06-27 VITALS — BP 118/72 | HR 71 | Ht 67.5 in | Wt 275.4 lb

## 2016-06-27 DIAGNOSIS — Z800 Family history of malignant neoplasm of digestive organs: Secondary | ICD-10-CM | POA: Diagnosis not present

## 2016-06-27 DIAGNOSIS — K5909 Other constipation: Secondary | ICD-10-CM | POA: Diagnosis not present

## 2016-06-27 MED ORDER — NA SULFATE-K SULFATE-MG SULF 17.5-3.13-1.6 GM/177ML PO SOLN: 1.0000 | Freq: Once | ORAL | 0 refills | Status: AC

## 2016-06-27 NOTE — Progress Notes (Signed)
Agree with assessment and plan as outlined.  

## 2016-06-27 NOTE — Patient Instructions (Addendum)
Avoid straining with bowel movements. If occurs then start daily Citrucel.  If you are age 45 or older, your body mass index should be between  23-30. Your Body mass index is 42.5 kg/m. If this is out of the aforementioned range listed, please consider follow up with your Primary Care Provider.  If you are age 18 or younger, your body mass index should be between 19-25. Your Body mass index is 42.5 kg/m. If this is out of the aformentioned range listed, please consider follow up with your Primary Care Provider.   You have been scheduled for a colonoscopy. Please follow written instructions given to you at your visit today.  Please pick up your prep supplies at the pharmacy within the next 1-3 days. If you use inhalers (even only as needed), please bring them with you on the day of your procedure. Your physician has requested that you go to www.startemmi.com and enter the access code given to you at your visit today. This web site gives a general overview about your procedure. However, you should still follow specific instructions given to you by our office regarding your preparation for the procedure.  Thank you for choosing [facility]

## 2016-06-27 NOTE — Progress Notes (Signed)
HPI:  Patient is a 46 year old female referred by PCP, Dr. Antonietta Jewel for rectal bleeding. Patient gives a very long history of intermittent hemorrhoidal bleeding. Sounds like she had a flex sig with banding of hemorrhoids many years ago. She has frequent constipation but when it occurs she eats salads and drinks coffee and this helps. Her MGM had colon cancer and her mother was diagnosed with colon cancer within last couple of years at age 22. Patient has no other complaint, GI or medical. She takes Phentermine for weight loss.  She has had a hysterectomy   Past Medical History:  Diagnosis Date  . Medical history non-contributory   . Obesity   . Uterine fibroid     Past Surgical History:  Procedure Laterality Date  . BILATERAL SALPINGECTOMY Bilateral 03/17/2013   Procedure: BILATERAL SALPINGECTOMY;  Surgeon: Marvene Staff, MD;  Location: [facility];  Service: Gynecology;  Laterality: Bilateral;  . DIAGNOSTIC LAPAROSCOPY    . ROBOTIC ASSISTED TOTAL HYSTERECTOMY N/A 03/17/2013   Procedure: ROBOTIC ASSISTED TOTAL HYSTERECTOMY;  Surgeon: Marvene Staff, MD;  Location: [facility];  Service: Gynecology;  Laterality: N/A;  3 hrs.   Family History  Problem Relation Age of Onset  . Hyperlipidemia Mother   . Hypertension Mother   . Diabetes Mother   . Colon cancer Mother 69  . Colon cancer Maternal Grandmother    Social History  Substance Use Topics  . Smoking status: Never Smoker  . Smokeless tobacco: Never Used  . Alcohol use No   Current Outpatient Prescriptions  Medication Sig Dispense Refill  . phentermine 37.5 MG capsule Take 37.5 mg by mouth every morning.    Marland Kitchen oxyCODONE-acetaminophen (PERCOCET/ROXICET) 5-325 MG per tablet Take 1-2 tablets by mouth every 4 (four) hours as needed for severe pain (moderate to severe pain (when tolerating fluids)). 30 tablet 0   No current facility-administered medications for this visit.    Allergies  Allergen Reactions  . Ginger  Hives, Shortness Of Breath and Swelling     Review of Systems: Back pain, sleeping problems. All other systems reviewed and negative except where noted in HPI.    Physical Exam: BP 118/72   Pulse 71   Ht 5' 7.5" (1.715 m)   Wt 275 lb 6.4 oz (124.9 kg)   LMP 02/18/2013   BMI 42.50 kg/m  Constitutional:  Obese black female in no acute distress. Psychiatric: Normal mood and affect. Behavior is normal. EENT:  Pupils equal. Conjunctivae are normal. No scleral icterus. Neck supple.  Cardiovascular: Normal rate, regular rhythm.  Pulmonary/chest: Effort normal and breath sounds normal. No wheezing, rales or rhonchi. Abdominal: Soft, nondistended, nontender. Bowel sounds active throughout. There are no masses palpable.  Extremities: no edema Lymphadenopathy: No cervical adenopathy noted. Neurological: Alert and oriented to person place and time. Skin: Skin is warm and dry. No rashes noted.   ASSESSMENT AND PLAN:  1. Pleasant 46 yo black female with chronic (years) rectal bleeding and hemorrhoids. Remote hemorrhoidal banding. Has chronic constipation which she manages with salads, coffee and other foods.  -for further evaluation patient will be scheduled for a colonoscopy.  The risks and benefits of the procedure were discussed and the patient agrees to proceed.  -Suggested addition of daily Citrucel or other fiber supplement  2. [place]. Mother diagnosed within last couple of years at age 17.   41. Obesity. Phenteramine will need to be held for 10 days prior to the procedure.  4. Anemia, borderline microcytic on 2014 labs. She has labslip to get CMET, CBC for upcoming physical with PCP    Tye Savoy, NP  06/27/2016, 8:49 AM  Cc:  Antonietta Jewel, MD

## 2016-07-30 ENCOUNTER — Telehealth: Payer: Self-pay | Admitting: Gastroenterology

## 2016-07-30 NOTE — Telephone Encounter (Signed)
Patient advised to start her medications and that it would not interfere with the colonoscopy.

## 2016-07-30 NOTE — Telephone Encounter (Signed)
Spoke to patient, she had her yearly physical and labs done by Dr. Sheryle Hail. States that she tested positive for a bacteria in her stomach that can cause ulcers, she was not sure if it was H. Pylori or not. Her PCP gave her amoxicillin 500 mg two tabs BID, lansoprazole 30 mg BID, Biaxin 500 mg BID, all for 10 days. She states that recently she had been having abdominal pain. She is having her colonoscopy on 5/10 and wanted to know if these medications should be started now?

## 2016-07-30 NOTE — Telephone Encounter (Signed)
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 She can go ahead and start therapy, it won't influence her colonoscopy at all.  Thanks

## 2016-08-06 ENCOUNTER — Encounter: Payer: Self-pay | Admitting: Gastroenterology

## 2016-08-16 ENCOUNTER — Encounter: Payer: 59 | Admitting: Gastroenterology

## 2017-04-09 HISTORY — PX: OTHER SURGICAL HISTORY: SHX169

## 2017-05-31 ENCOUNTER — Other Ambulatory Visit (HOSPITAL_COMMUNITY): Payer: Self-pay | Admitting: Family Medicine

## 2017-05-31 ENCOUNTER — Other Ambulatory Visit (HOSPITAL_COMMUNITY): Payer: Self-pay | Admitting: Internal Medicine

## 2017-05-31 ENCOUNTER — Ambulatory Visit (HOSPITAL_COMMUNITY)
Admission: RE | Admit: 2017-05-31 | Discharge: 2017-05-31 | Disposition: A | Discharge status: Home / Self Care | Payer: 59 | Source: Ambulatory Visit | Attending: Internal Medicine | Admitting: Internal Medicine

## 2017-05-31 DIAGNOSIS — Q741 Congenital malformation of knee: Secondary | ICD-10-CM | POA: Diagnosis not present

## 2017-05-31 DIAGNOSIS — M25561 Pain in right knee: Secondary | ICD-10-CM | POA: Insufficient documentation

## 2017-07-08 ENCOUNTER — Other Ambulatory Visit: Payer: Self-pay | Admitting: Internal Medicine

## 2017-07-08 DIAGNOSIS — Z139 Encounter for screening, unspecified: Secondary | ICD-10-CM

## 2017-08-14 ENCOUNTER — Ambulatory Visit
Admission: RE | Admit: 2017-08-14 | Discharge: 2017-08-14 | Disposition: A | Discharge status: Home / Self Care | Payer: 59 | Source: Ambulatory Visit | Attending: Internal Medicine | Admitting: Internal Medicine

## 2017-08-14 DIAGNOSIS — Z139 Encounter for screening, unspecified: Secondary | ICD-10-CM

## 2017-08-15 ENCOUNTER — Other Ambulatory Visit: Payer: Self-pay | Admitting: Internal Medicine

## 2017-08-15 DIAGNOSIS — R928 Other abnormal and inconclusive findings on diagnostic imaging of breast: Secondary | ICD-10-CM

## 2017-08-23 ENCOUNTER — Ambulatory Visit
Admission: RE | Admit: 2017-08-23 | Discharge: 2017-08-23 | Disposition: A | Discharge status: Home / Self Care | Payer: 59 | Source: Ambulatory Visit | Attending: Internal Medicine | Admitting: Internal Medicine

## 2017-08-23 DIAGNOSIS — R928 Other abnormal and inconclusive findings on diagnostic imaging of breast: Secondary | ICD-10-CM

## 2019-07-02 ENCOUNTER — Encounter: Payer: Self-pay | Admitting: Gastroenterology

## 2019-07-02 ENCOUNTER — Other Ambulatory Visit: Payer: Self-pay

## 2019-07-02 ENCOUNTER — Ambulatory Visit (INDEPENDENT_AMBULATORY_CARE_PROVIDER_SITE_OTHER): Payer: Self-pay | Admitting: Gastroenterology

## 2019-07-02 VITALS — BP 122/82 | HR 90 | Temp 98.0°F | Ht 69.0 in | Wt 273.0 lb

## 2019-07-02 DIAGNOSIS — Z01818 Encounter for other preprocedural examination: Secondary | ICD-10-CM

## 2019-07-02 DIAGNOSIS — Z800 Family history of malignant neoplasm of digestive organs: Secondary | ICD-10-CM

## 2019-07-02 DIAGNOSIS — R109 Unspecified abdominal pain: Secondary | ICD-10-CM

## 2019-07-02 MED ORDER — NA SULFATE-K SULFATE-MG SULF 17.5-3.13-1.6 GM/177ML PO SOLN: 1.0000 | Freq: Once | ORAL | 0 refills | Status: AC

## 2019-07-02 NOTE — Patient Instructions (Addendum)
If you are age 48 or older, your body mass index should be between 23-30. Your Body mass index is 40.32 kg/m. If this is out of the aforementioned range listed, please consider follow up with your Primary Care Provider.  If you are age 28 or younger, your body mass index should be between 19-25. Your Body mass index is 40.32 kg/m. If this is out of the aformentioned range listed, please consider follow up with your Primary Care Provider.   You have been scheduled for a CT scan of the abdomen and pelvis at [facility] ([address]---this is in the same building as [employer]).   You are scheduled on Monday 07/06/19 at 3 pm. You should arrive 15 minutes prior to your appointment time for registration. Please follow the written instructions below on the day of your exam:  WARNING: IF YOU ARE ALLERGIC TO IODINE/X-RAY DYE, PLEASE NOTIFY RADIOLOGY IMMEDIATELY AT [phone]! YOU WILL BE GIVEN A 13 HOUR PREMEDICATION PREP.  1) Do not eat or drink anything after 11 am (4 hours prior to your test) 2) You have been given 2 bottles of oral contrast to drink. The solution may taste better if refrigerated, but do NOT add ice or any other liquid to this solution. Shake well before drinking.    Drink 1 bottle of contrast @ 1 pm (2 hours prior to your exam)  Drink 1 bottle of contrast @ 2 pm (1 hour prior to your exam)  You may take any medications as prescribed with a small amount of water, if necessary. If you take any of the following medications: METFORMIN, GLUCOPHAGE, GLUCOVANCE, AVANDAMET, RIOMET, FORTAMET, Pala MET, JANUMET, GLUMETZA or METAGLIP, you MAY be asked to HOLD this medication 48 hours AFTER the exam.  The purpose of you drinking the oral contrast is to aid in the visualization of your intestinal tract. The contrast solution may cause some diarrhea. Depending on your individual set of symptoms, you may also receive an intravenous injection of x-ray contrast/dye.  Plan on being at [facility] for 30 minutes or longer, depending on the type of exam you are having performed.  This test typically takes 30-45 minutes to complete.  If you have any questions regarding your exam or if you need to reschedule, you may call the CT department at [phone] between the hours of 8:00 am and 5:00 pm, Monday-Friday.  _________________________________________________________________

## 2019-07-02 NOTE — Progress Notes (Signed)
07/02/2019 Diane Ryan [id number] Mar 12, 1971   HISTORY OF PRESENT ILLNESS:  This is a 49 year old female who was seen here previously by Tye Savoy, NP, in 2018 and was scheduled for colonoscopy, but it was never performed because apparently it was not approved by her insurance company.  She has family history of colon cancer in her mother who was diagnosed at around age 3.  Patient has never had colonoscopy in the past.  She says that overall she moves her bowels well with dietary intake of vegetables, fruit, etc.  Has had rectal bleeding the past, but nothing recent.   Today she complains of abdominal pain on her left side.  She says that she had this pain in the past, but then had a hysterectomy and says that the surgeon said something about having to "slowly pull her intestines out" of ? Hernia.  ? Scar tissue.  After her surgery the pain resolved for a long time, but now has been back again.  She says that when she bends over and stands back up again it is so severe.  Now when she bends over she holds her side so that it does not hurt.  She describes it as feeling like something is sliding down.  Has to sleep on the left and keeps pillow on that side as pressure.    Past Medical History:  Diagnosis Date  . Medical history non-contributory   . Obesity   . Uterine fibroid    Past Surgical History:  Procedure Laterality Date  . BILATERAL SALPINGECTOMY Bilateral 03/17/2013   Procedure: BILATERAL SALPINGECTOMY;  Surgeon: Marvene Staff, MD;  Location: [facility];  Service: Gynecology;  Laterality: Bilateral;  . DIAGNOSTIC LAPAROSCOPY    . ROBOTIC ASSISTED TOTAL HYSTERECTOMY N/A 03/17/2013   Procedure: ROBOTIC ASSISTED TOTAL HYSTERECTOMY;  Surgeon: Marvene Staff, MD;  Location: [facility];  Service: Gynecology;  Laterality: N/A;  3 hrs.    reports that she has never smoked. She has never used smokeless tobacco. She reports that she does not drink alcohol or use  drugs. family history includes Breast cancer in her maternal aunt; Colon cancer in her maternal grandmother; Colon cancer (age of onset: 99) in her mother; Diabetes in her mother; Hyperlipidemia in her mother; Hypertension in her mother. Allergies  Allergen Reactions  . Ginger Hives, Shortness Of Breath and Swelling      Outpatient Encounter Medications as of 07/02/2019  Medication Sig  . oxyCODONE-acetaminophen (PERCOCET/ROXICET) 5-325 MG per tablet Take 1-2 tablets by mouth every 4 (four) hours as needed for severe pain (moderate to severe pain (when tolerating fluids)). (Patient taking differently: Take 1-2 tablets by mouth every 4 (four) hours as needed for severe pain (moderate to severe pain (when tolerating fluids)). As needed)  . phentermine 37.5 MG capsule Take 37.5 mg by mouth every morning.   No facility-administered encounter medications on file as of 07/02/2019.    REVIEW OF SYSTEMS  : All other systems reviewed and negative except where noted in the History of Present Illness.   PHYSICAL EXAM: BP 122/82   Pulse 90   Temp 98 F (36.7 C)   Ht 5\' 9"  ([id number] m)   Wt 273 lb (123.8 kg)   LMP 02/18/2013   BMI 40.32 kg/m  General: Well developed AA female in no acute distress Head: Normocephalic and atraumatic Eyes:  Sclerae anicteric, conjunctiva pink. Ears: Normal auditory acuity Lungs: Clear throughout to auscultation; no increased WOB. Heart: Regular rate  and rhythm; no M/R/G. Abdomen: Soft, non-distended.  BS present.  Left sided TTP. Rectal:  Will be done at the time of colonoscopy. Musculoskeletal: Symmetrical with no gross deformities  Skin: No lesions on visible extremities Extremities: No edema  Neurological: Alert oriented x 4, grossly non-focal Psychological:  Alert and cooperative. Normal mood and affect  ASSESSMENT AND PLAN: *Family history of colon cancer:  Mother diagnosed at around age 24.  Patient never had colonoscopy in the past.  Will plan for  colonoscopy with Dr. Havery Moros.  The risks, benefits, and alternatives to colonoscopy were discussed with the patient and she consents to proceed.  *Left sided abdominal pain:  Not sure about the cause of this.  Hurts when she bends over and stands back up again, but sore all the time.  She is asking about some type of hernia.  Will plan for CT scan of the abdomen and pelvis.   CC:  Antonietta Jewel, MD

## 2019-07-02 NOTE — Progress Notes (Signed)
Agree with assessment and plan as outlined.  

## 2019-07-06 ENCOUNTER — Other Ambulatory Visit: Payer: Self-pay

## 2019-07-06 ENCOUNTER — Ambulatory Visit (INDEPENDENT_AMBULATORY_CARE_PROVIDER_SITE_OTHER)
Admission: RE | Admit: 2019-07-06 | Discharge: 2019-07-06 | Disposition: A | Discharge status: Home / Self Care | Payer: 59 | Source: Ambulatory Visit | Attending: Gastroenterology | Admitting: Gastroenterology

## 2019-07-06 DIAGNOSIS — R109 Unspecified abdominal pain: Secondary | ICD-10-CM

## 2019-07-06 MED ORDER — IOHEXOL 300 MG/ML  SOLN
100.0000 mL | Freq: Once | INTRAMUSCULAR | Status: AC | PRN
  Administered 2019-07-06: 15:00:00 100 mL via INTRAVENOUS

## 2019-07-07 ENCOUNTER — Telehealth: Payer: Self-pay | Admitting: Gastroenterology

## 2019-07-07 NOTE — Telephone Encounter (Signed)
The pt has been advised that the report has not been reviewed by Janett Billow as of today and we will contact her as soon as possible.

## 2019-07-13 NOTE — Telephone Encounter (Signed)
Diane Ryan the pt is calling for CT results.  Please advise

## 2019-07-13 NOTE — Telephone Encounter (Signed)
Pt is calling back about her test results 

## 2019-07-21 ENCOUNTER — Other Ambulatory Visit: Payer: Self-pay | Admitting: Gastroenterology

## 2019-07-21 ENCOUNTER — Ambulatory Visit (INDEPENDENT_AMBULATORY_CARE_PROVIDER_SITE_OTHER): Payer: 59

## 2019-07-21 DIAGNOSIS — Z1159 Encounter for screening for other viral diseases: Secondary | ICD-10-CM

## 2019-07-21 LAB — SARS CORONAVIRUS 2 (TAT 6-24 HRS): SARS Coronavirus 2: NEGATIVE

## 2019-07-23 ENCOUNTER — Other Ambulatory Visit: Payer: Self-pay

## 2019-07-23 ENCOUNTER — Encounter: Payer: Self-pay | Admitting: Gastroenterology

## 2019-07-23 ENCOUNTER — Ambulatory Visit (AMBULATORY_SURGERY_CENTER): Payer: 59 | Admitting: Gastroenterology

## 2019-07-23 VITALS — BP 115/83 | HR 68 | Temp 97.3°F | Resp 13 | Ht 69.0 in | Wt 273.0 lb

## 2019-07-23 DIAGNOSIS — Z1211 Encounter for screening for malignant neoplasm of colon: Secondary | ICD-10-CM | POA: Diagnosis not present

## 2019-07-23 DIAGNOSIS — K639 Disease of intestine, unspecified: Secondary | ICD-10-CM | POA: Diagnosis not present

## 2019-07-23 DIAGNOSIS — K6289 Other specified diseases of anus and rectum: Secondary | ICD-10-CM

## 2019-07-23 DIAGNOSIS — K649 Unspecified hemorrhoids: Secondary | ICD-10-CM | POA: Diagnosis not present

## 2019-07-23 DIAGNOSIS — Z800 Family history of malignant neoplasm of digestive organs: Secondary | ICD-10-CM | POA: Diagnosis present

## 2019-07-23 DIAGNOSIS — D122 Benign neoplasm of ascending colon: Secondary | ICD-10-CM

## 2019-07-23 MED ORDER — SODIUM CHLORIDE 0.9 % IV SOLN: 500.0000 mL | Freq: Once | INTRAVENOUS | Status: DC

## 2019-07-23 NOTE — Progress Notes (Signed)
Called to room to assist during endoscopic procedure.  Patient ID and intended procedure confirmed with present staff. Received instructions for my participation in the procedure from the performing physician.  

## 2019-07-23 NOTE — Op Note (Signed)
Diane Ryan: Diane Ryan Procedure Date: 07/23/2019 8:03 AM MRN: [id number] Endoscopist: Remo Lipps P. Havery Moros , MD Age: 49 Referring MD:  Date of Birth: 1971-02-12 Gender: Female Account #: [id number] Procedure:                Colonoscopy Indications:              Screening in patient at increased risk: Family                            history of 1st-degree relative with colorectal                            cancer (mother diagnosed age 62) Medicines:                Monitored Anesthesia Care Procedure:                Pre-Anesthesia Assessment:                           - Prior to the procedure, a History and Physical                            was performed, and patient medications and                            allergies were reviewed. The patient's tolerance of                            previous anesthesia was also reviewed. The risks                            and benefits of the procedure and the sedation                            options and risks were discussed with the patient.                            All questions were answered, and informed consent                            was obtained. Prior Anticoagulants: The patient has                            taken no previous anticoagulant or antiplatelet                            agents. ASA Grade Assessment: II - A patient with                            mild systemic disease. After reviewing the risks                            and benefits, the patient was deemed in  satisfactory condition to undergo the procedure.                           After obtaining informed consent, the colonoscope                            was passed under direct vision. Throughout the                            procedure, the patient's blood pressure, pulse, and                            oxygen saturations were monitored continuously. The                            Colonoscope was  introduced through the anus and                            advanced to the the cecum, identified by                            appendiceal orifice and ileocecal valve. The                            colonoscopy was performed without difficulty. The                            patient tolerated the procedure well. The quality                            of the bowel preparation was good. The ileocecal                            valve, appendiceal orifice, and rectum were                            photographed. Scope In: 8:05:59 AM Scope Out: 8:25:02 AM Scope Withdrawal Time: 0 hours 16 minutes 30 seconds  Total Procedure Duration: 0 hours 19 minutes 3 seconds  Findings:                 The perianal and digital rectal examinations were                            normal.                           A 3 mm polyp was found in the ascending colon. The                            polyp was sessile. The polyp was removed with a                            cold snare. Resection and retrieval were complete.  An area of mucosa was found in the distal rectum                            approximating the dentate line that was slightly                            nodular / ? polypoid. Biopsies were taken with a                            cold forceps for histology to rule out adenomatous                            change.                           Internal hemorrhoids were found during                            retroflexion. The hemorrhoids were small.                           The exam was otherwise without abnormality. Complications:            No immediate complications. Estimated blood loss:                            Minimal. Estimated Blood Loss:     Estimated blood loss was minimal. Impression:               - One 3 mm polyp in the ascending colon, removed                            with a cold snare. Resected and retrieved.                           - Altered mucosa in the  distal rectum, ? polypoid                            vs normal variant. Biopsied to rule out adenomatous                            change.                           - Internal hemorrhoids.                           - The examination was otherwise normal. Recommendation:           - Patient has a contact number available for                            emergencies. The signs and symptoms of potential                            delayed complications were discussed with  the                            patient. Return to normal activities tomorrow.                            Written discharge instructions were provided to the                            patient.                           - Resume previous diet.                           - Continue present medications.                           - Await pathology results. Remo Lipps P. [name], MD 07/23/2019 8:29:52 AM This report has been signed electronically.

## 2019-07-23 NOTE — Patient Instructions (Signed)
Handouts provided on polyps and hemorrhoids.   YOU HAD AN ENDOSCOPIC PROCEDURE TODAY AT THE [facility]:   Refer to the procedure report that was given to you for any specific questions about what was found during the examination.  If the procedure report does not answer your questions, please call your gastroenterologist to clarify.  If you requested that your care partner not be given the details of your procedure findings, then the procedure report has been included in a sealed envelope for you to review at your convenience later.  YOU SHOULD EXPECT: Some feelings of bloating in the abdomen. Passage of more gas than usual.  Walking can help get rid of the air that was put into your GI tract during the procedure and reduce the bloating. If you had a lower endoscopy (such as a colonoscopy or flexible sigmoidoscopy) you may notice spotting of blood in your stool or on the toilet paper. If you underwent a bowel prep for your procedure, you may not have a normal bowel movement for a few days.  Please Note:  You might notice some irritation and congestion in your nose or some drainage.  This is from the oxygen used during your procedure.  There is no need for concern and it should clear up in a day or so.  SYMPTOMS TO REPORT IMMEDIATELY:  Following lower endoscopy (colonoscopy or flexible sigmoidoscopy):  Excessive amounts of blood in the stool  Significant tenderness or worsening of abdominal pains  Swelling of the abdomen that is new, acute  Fever of 100F or higher  For urgent or emergent issues, a gastroenterologist can be reached at any hour by calling [phone]. Do not use MyChart messaging for urgent concerns.    DIET:  We do recommend a small meal at first, but then you may proceed to your regular diet.  Drink plenty of fluids but you should avoid alcoholic beverages for 24 hours.  ACTIVITY:  You should plan to take it easy for the rest of today and you should NOT DRIVE  or use heavy machinery until tomorrow (because of the sedation medicines used during the test).    FOLLOW UP: Our staff will call the number listed on your records 48-72 hours following your procedure to check on you and address any questions or concerns that you may have regarding the information given to you following your procedure. If we do not reach you, we will leave a message.  We will attempt to reach you two times.  During this call, we will ask if you have developed any symptoms of COVID 19. If you develop any symptoms (ie: fever, flu-like symptoms, shortness of breath, cough etc.) before then, please call [phone].  If you test positive for Covid 19 in the 2 weeks post procedure, please call and report this information to us.    If any biopsies were taken you will be contacted by phone or by letter within the next 1-3 weeks.  Please call us at [phone] if you have not heard about the biopsies in 3 weeks.    SIGNATURES/CONFIDENTIALITY: You and/or your care partner have signed paperwork which will be entered into your electronic medical record.  These signatures attest to the fact that that the information above on your After Visit Summary has been reviewed and is understood.  Full responsibility of the confidentiality of this discharge information lies with you and/or your care-partner.  

## 2019-07-23 NOTE — Progress Notes (Signed)
To PACu, VSS. Report to [email or web address] 

## 2019-07-27 ENCOUNTER — Telehealth: Payer: Self-pay | Admitting: *Deleted

## 2019-07-27 NOTE — Telephone Encounter (Signed)
  Follow up Call-  Call back number 07/23/2019  Post procedure Call Back phone  # [phone]  Permission to leave phone message Yes  Some recent data might be hidden     Patient questions:  Do you have a fever, pain , or abdominal swelling? No. Pain Score  0 *  Have you tolerated food without any problems? Yes.    Have you been able to return to your normal activities? Yes.    Do you have any questions about your discharge instructions: Diet   No. Medications  No. Follow up visit  No.  Do you have questions or concerns about your Care? No.  Actions: * If pain score is 4 or above: No action needed, pain <4.  1. Have you developed a fever since your procedure? no  2.   Have you had an respiratory symptoms (SOB or cough) since your procedure? no  3.   Have you tested positive for COVID 19 since your procedure no  4.   Have you had any family members/close contacts diagnosed with the COVID 19 since your procedure?  no   If yes to any of these questions please route to Joylene John, RN and Erenest Rasher, RN

## 2019-10-15 ENCOUNTER — Telehealth: Payer: Self-pay

## 2019-10-15 NOTE — Telephone Encounter (Signed)
Left message on machine to call back  

## 2019-10-15 NOTE — Telephone Encounter (Signed)
-----   Message from Timothy Lasso, RN sent at 10/13/2019  8:57 AM EDT -----  ----- Message ----- From: Timothy Lasso, RN Sent: 10/13/2019 To: Timothy Lasso, RN  follow-up in 3 months with a multiphase contrast-enhanced CT scan just to ensure stabilization

## 2019-10-20 NOTE — Telephone Encounter (Signed)
Left message on machine to call back  

## 2019-10-21 NOTE — Telephone Encounter (Signed)
I have been unable to reach pt will mail letter and have pt contact our office to schedule CT scan.

## 2020-08-28 IMAGING — CT CT ABD-PELV W/ CM
2 of 5 series · 16 of 46 positions shown, 18 images · IV contrast (OMNIPAQUE 300)
Comparison: None.

CLINICAL DATA: Epigastric pain.

EXAM:
CT ABDOMEN AND PELVIS WITH CONTRAST
TECHNIQUE: Multidetector CT imaging of the abdomen and pelvis was performed
using the standard protocol following bolus administration of
intravenous contrast.
CONTRAST:  100mL OMNIPAQUE IOHEXOL 300 MG/ML  SOLN

[Series 2: abd/pel w · axial · 0.72mm/px · z∈[+900,+1305]mm · 13 of 93 slices shown, 15 images]
[im 6/93  soft-tissue]
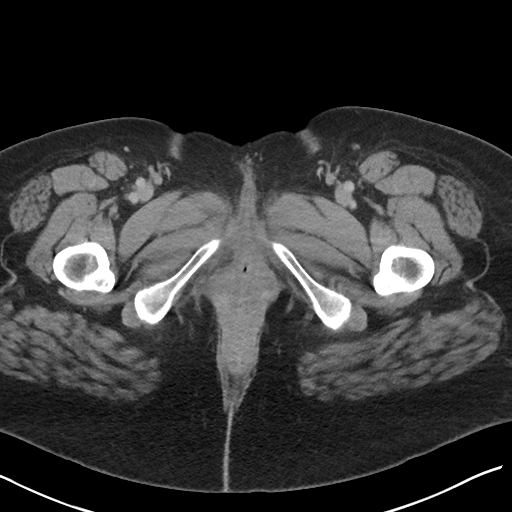
[im 6/93  bone]
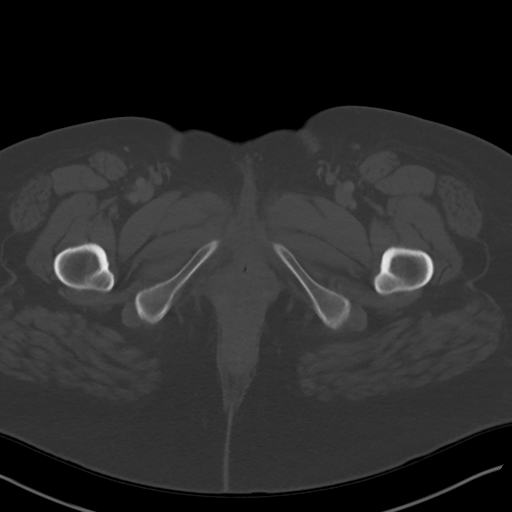
[im 11/93  soft-tissue]
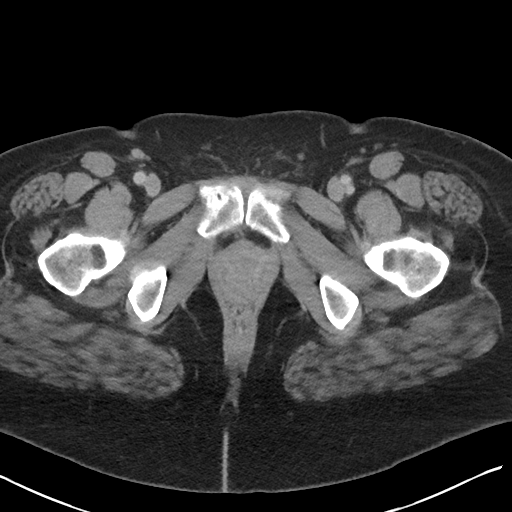
[im 21/93  soft-tissue]
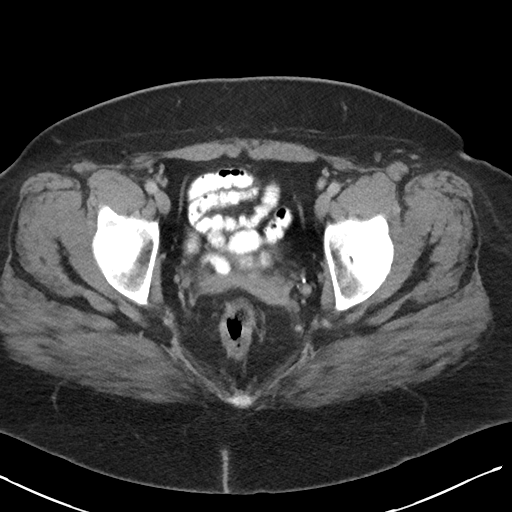
[im 26/93  soft-tissue]
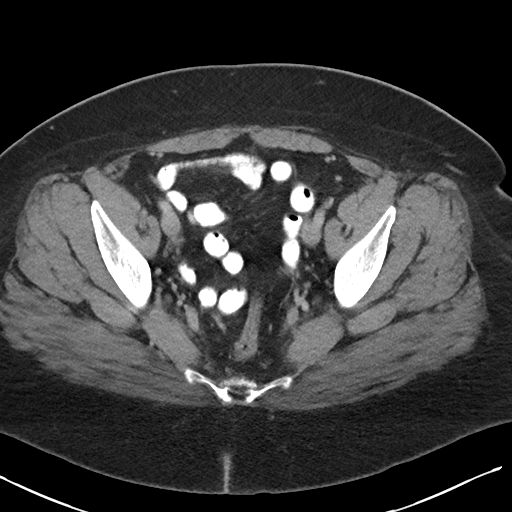
[im 31/93  soft-tissue]
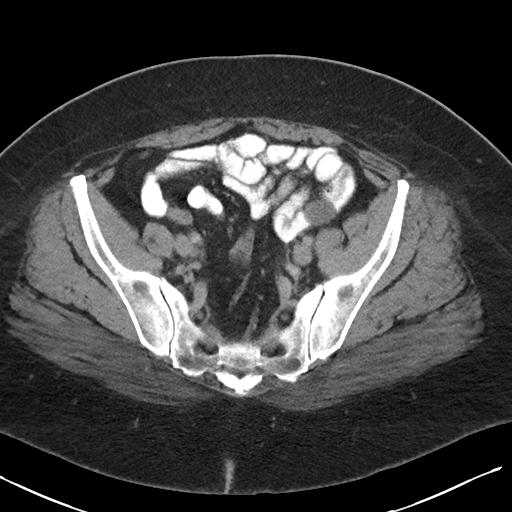
[im 41/93  soft-tissue]
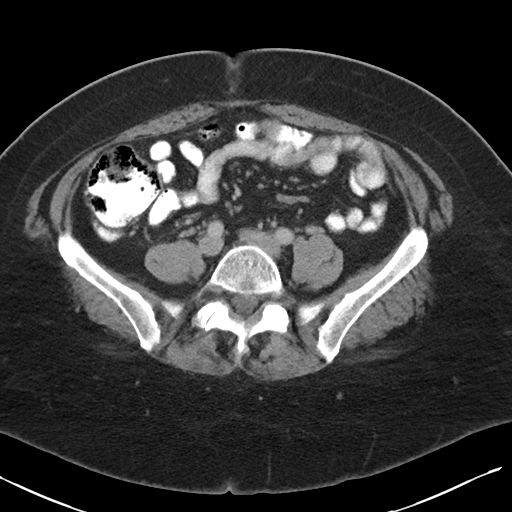
[im 47/93  soft-tissue]
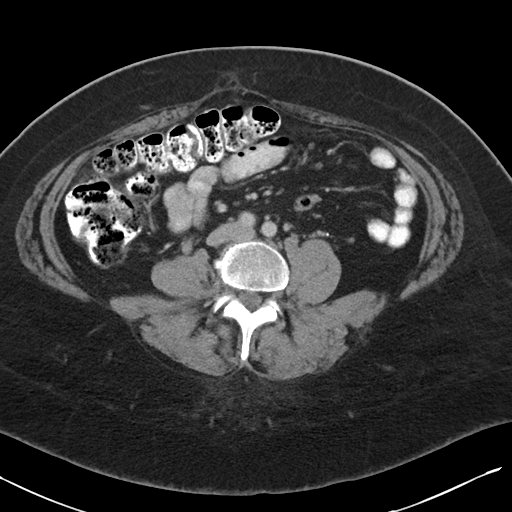
[im 52/93  soft-tissue]
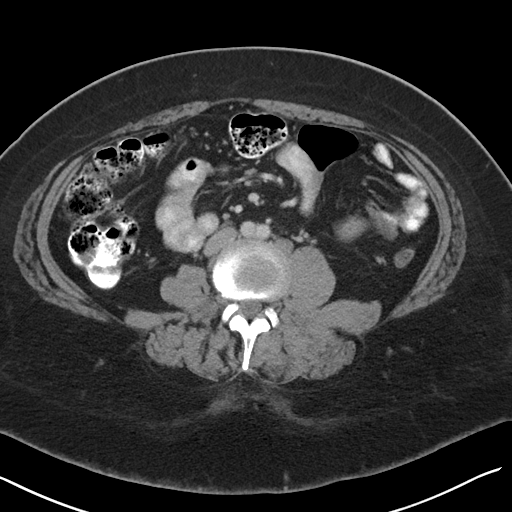
[im 62/93  soft-tissue]
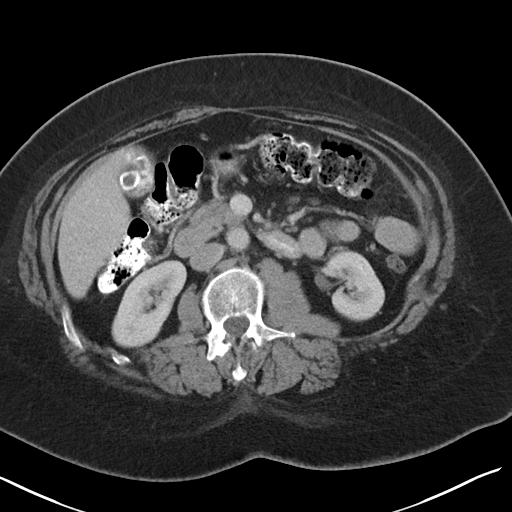
[im 62/93  bone]
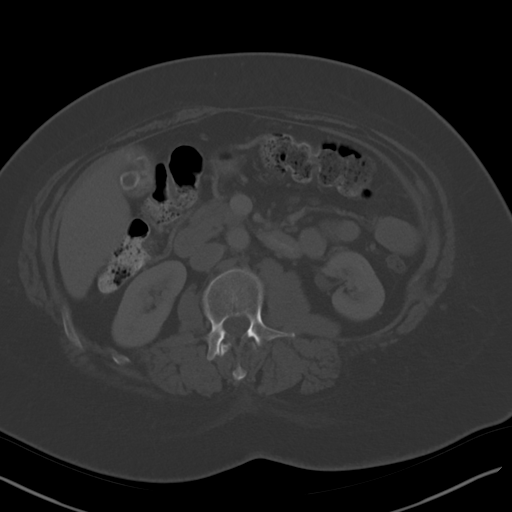
[im 67/93  soft-tissue]
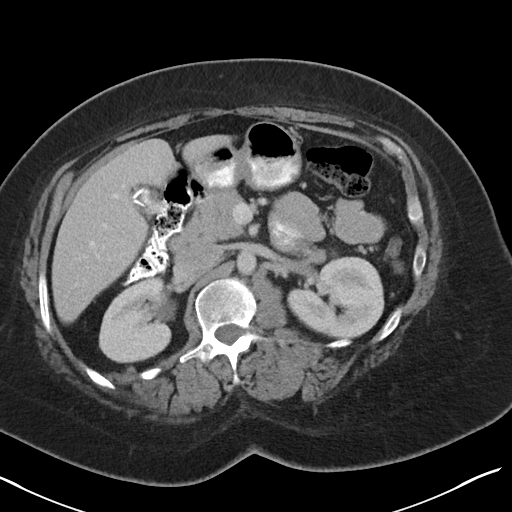
[im 72/93  soft-tissue]
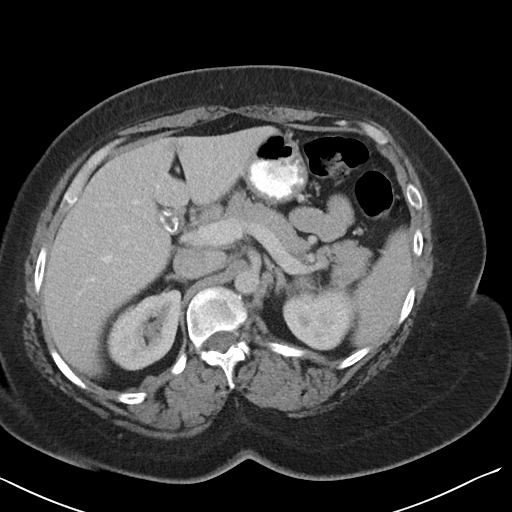
[im 82/93  soft-tissue]
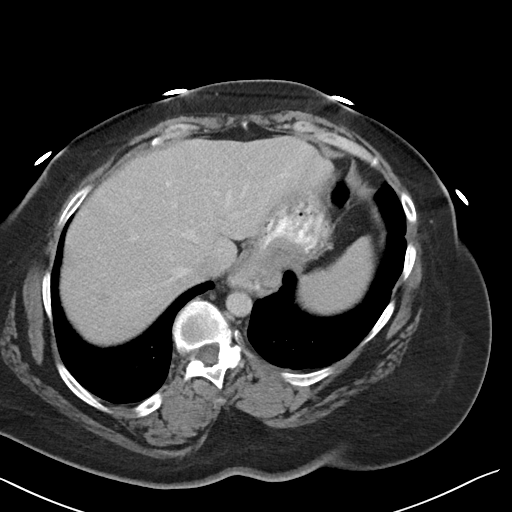
[im 87/93  soft-tissue]
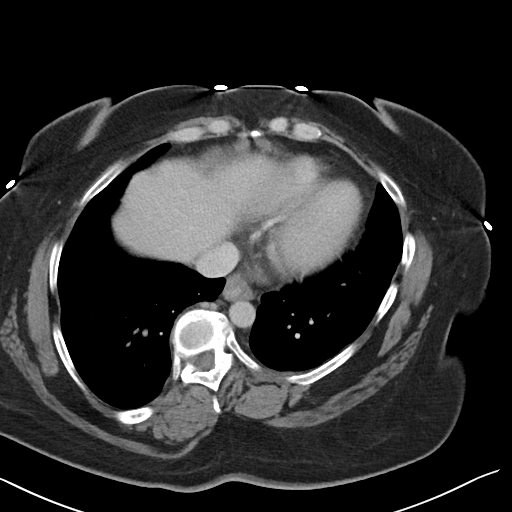

[Series 5: abd/pel w st · coronal · 0.77mm/px · 3 of 92 slices shown]
[im 31/92  soft-tissue]
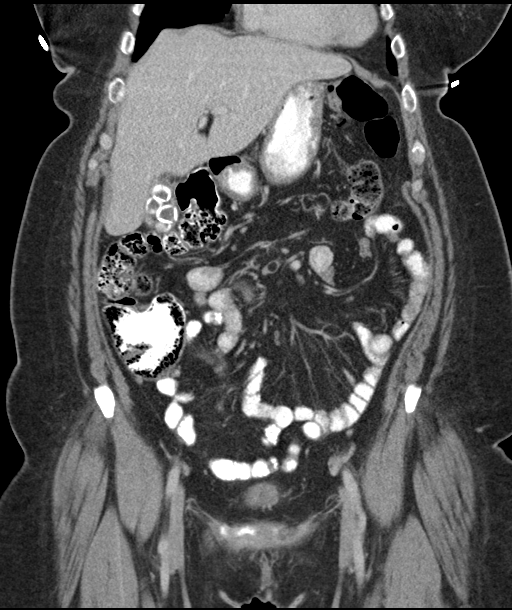
[im 41/92  soft-tissue]
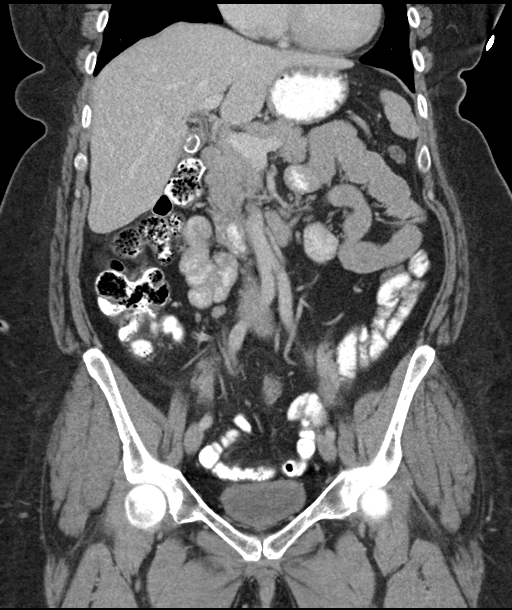
[im 51/92  soft-tissue]
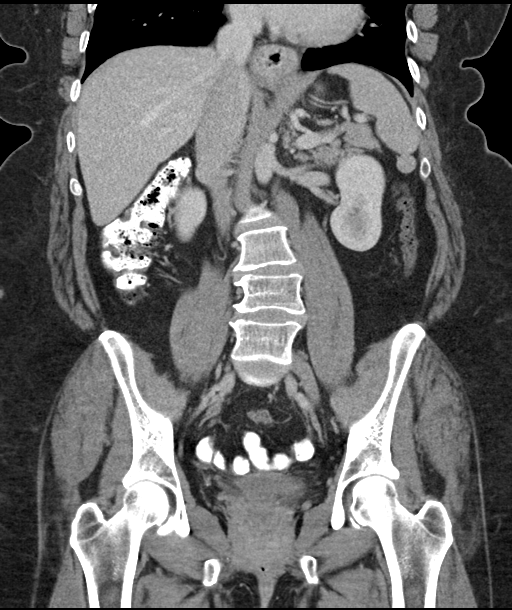

[16 of 46 positions shown; findings below may reference images not displayed]

FINDINGS: Lower chest: Heart is mildly enlarged. Dependent atelectasis left
lower lobe. No pleural effusion.

Hepatobiliary: Liver is normal in size and contour. There is a
cm low-attenuation lesion hepatic dome (image 5; series 2).
Additionally within the right hepatic lobe there is a 0.9 cm
low-attenuation lesion (image 13; series 2). Multiple calcified
stones within the gallbladder lumen. No intrahepatic or extrahepatic
biliary ductal dilatation.

Pancreas: Unremarkable

Spleen: Unremarkable

Adrenals/Urinary Tract: Normal adrenal glands. Kidneys enhance
symmetrically with contrast. No hydronephrosis. Urinary bladder is
unremarkable.

Stomach/Bowel: Moderate-sized hiatal hernia. No evidence for small
bowel obstruction. No free fluid or free intraperitoneal air.

Vascular/Lymphatic: Normal caliber abdominal aorta. No
retroperitoneal lymphadenopathy.

Reproductive: Patient status post hysterectomy.

Other: None.

Musculoskeletal: Lower lumbar spine and lower thoracic spine
degenerative changes. No aggressive or acute appearing osseous
lesions.
IMPRESSION: 1. No acute process within the abdomen or pelvis.
2. Indeterminate low-attenuation lesions within the liver. Given the
location of the larger lesion in the hepatic dome, this may be
difficult to characterize with MRI. Recommend 3 month follow-up
multiphase contrast-enhanced CT or potentially MRI as indicated.
3. Cholelithiasis without CT evidence for acute cholecystitis.
